# Patient Record
Sex: Male | Born: 1975 | Race: White | Hispanic: No | Marital: Married | Smoking: Former smoker
Health system: Southern US, Community
[De-identification: ages and names within clinical notes are randomized; demographics above are authoritative.]

## PROBLEM LIST (undated history)

## (undated) DIAGNOSIS — F419 Anxiety disorder, unspecified: Secondary | ICD-10-CM

## (undated) DIAGNOSIS — J329 Chronic sinusitis, unspecified: Secondary | ICD-10-CM

## (undated) DIAGNOSIS — R200 Anesthesia of skin: Secondary | ICD-10-CM

## (undated) DIAGNOSIS — I10 Essential (primary) hypertension: Secondary | ICD-10-CM

## (undated) DIAGNOSIS — T8859XA Other complications of anesthesia, initial encounter: Secondary | ICD-10-CM

## (undated) DIAGNOSIS — K859 Acute pancreatitis without necrosis or infection, unspecified: Secondary | ICD-10-CM

## (undated) DIAGNOSIS — K219 Gastro-esophageal reflux disease without esophagitis: Secondary | ICD-10-CM

## (undated) DIAGNOSIS — F909 Attention-deficit hyperactivity disorder, unspecified type: Secondary | ICD-10-CM

## (undated) DIAGNOSIS — M1990 Unspecified osteoarthritis, unspecified site: Secondary | ICD-10-CM

## (undated) DIAGNOSIS — E119 Type 2 diabetes mellitus without complications: Secondary | ICD-10-CM

## (undated) DIAGNOSIS — R202 Paresthesia of skin: Secondary | ICD-10-CM

## (undated) DIAGNOSIS — T4145XA Adverse effect of unspecified anesthetic, initial encounter: Secondary | ICD-10-CM

## (undated) HISTORY — PX: ORTHOPEDIC SURGERY: SHX850

## (undated) HISTORY — DX: Anesthesia of skin: R20.2

## (undated) HISTORY — DX: Gastro-esophageal reflux disease without esophagitis: K21.9

## (undated) HISTORY — DX: Anesthesia of skin: R20.0

---

## 2010-07-24 ENCOUNTER — Emergency Department (HOSPITAL_COMMUNITY)
Admission: EM | Admit: 2010-07-24 | Discharge: 2010-07-25 | Disposition: A | Discharge status: Home / Self Care | Payer: Self-pay | Attending: Emergency Medicine | Admitting: Emergency Medicine

## 2010-07-24 DIAGNOSIS — R071 Chest pain on breathing: Secondary | ICD-10-CM | POA: Insufficient documentation

## 2010-07-25 ENCOUNTER — Emergency Department (HOSPITAL_COMMUNITY): Payer: Self-pay

## 2010-07-25 LAB — POCT CARDIAC MARKERS
CKMB, poc: 2.5 ng/mL (ref 1.0–8.0)
Myoglobin, poc: 185 ng/mL (ref 12–200)
Troponin i, poc: <0.05 ng/mL (ref 0.00–0.09)

## 2010-10-05 ENCOUNTER — Emergency Department (HOSPITAL_COMMUNITY)
Admission: EM | Admit: 2010-10-05 | Discharge: 2010-10-05 | Disposition: A | Discharge status: Home / Self Care | Payer: Self-pay | Attending: Emergency Medicine | Admitting: Emergency Medicine

## 2010-10-05 DIAGNOSIS — R51 Headache: Secondary | ICD-10-CM | POA: Insufficient documentation

## 2010-10-05 DIAGNOSIS — I10 Essential (primary) hypertension: Secondary | ICD-10-CM | POA: Insufficient documentation

## 2010-10-05 DIAGNOSIS — R42 Dizziness and giddiness: Secondary | ICD-10-CM | POA: Insufficient documentation

## 2010-10-05 DIAGNOSIS — R599 Enlarged lymph nodes, unspecified: Secondary | ICD-10-CM | POA: Insufficient documentation

## 2011-01-31 ENCOUNTER — Encounter: Payer: Self-pay | Admitting: Emergency Medicine

## 2011-01-31 ENCOUNTER — Emergency Department (HOSPITAL_COMMUNITY)
Admission: EM | Admit: 2011-01-31 | Discharge: 2011-01-31 | Disposition: A | Discharge status: Home / Self Care | Payer: 59 | Attending: Emergency Medicine | Admitting: Emergency Medicine

## 2011-01-31 DIAGNOSIS — I10 Essential (primary) hypertension: Secondary | ICD-10-CM | POA: Insufficient documentation

## 2011-01-31 DIAGNOSIS — J329 Chronic sinusitis, unspecified: Secondary | ICD-10-CM | POA: Insufficient documentation

## 2011-01-31 HISTORY — DX: Chronic sinusitis, unspecified: J32.9

## 2011-01-31 HISTORY — DX: Essential (primary) hypertension: I10

## 2011-01-31 MED ORDER — SULFAMETHOXAZOLE-TMP DS 800-160 MG PO TABS
1.0000 | ORAL_TABLET | Freq: Once | ORAL | Status: AC
  Administered 2011-01-31: 1 via ORAL
  Filled 2011-01-31: qty 1

## 2011-01-31 MED ORDER — SULFAMETHOXAZOLE-TRIMETHOPRIM 800-160 MG PO TABS: 1.0000 | ORAL_TABLET | Freq: Two times a day (BID) | ORAL | Status: AC

## 2011-01-31 NOTE — ED Notes (Signed)
Patient states has had a sinus infection x 5 weeks; states has upper teeth pain, bilateral ear pain and high blood pressure.

## 2011-01-31 NOTE — ED Provider Notes (Signed)
History     CSN: [id number] Arrival date & time: 01/31/2011 10:09 PM  Chief Complaint  Patient presents with  . Sinusitis  . Hypertension   HPI Comments: Seen 1109. Patient with 5 weeks of sinus pressure. Has annual episodes of sinusitis. No drainage. No fevers.   Patient is a 35 y.o. male presenting with sinusitis and hypertension. The history is provided by the patient.  Sinusitis  This is a new problem. Episode onset: 5 weeks. The problem has not changed since onset.There has been no fever. The pain is at a severity of 7/10. The pain is moderate. The pain has been constant since onset. Associated symptoms include sinus pressure. Associated symptoms comments: Nasal stuffiness, facial pain. Treatments tried: nettie pot, decongestant, allergy medicine, humidifier, nsaids. The treatment provided no relief.  Hypertension    Past Medical History  Diagnosis Date  . Hypertension   . Sinusitis     Past Surgical History  Procedure Date  . Orthopedic surgery     left knee surgery    No family history on file.  History  Substance Use Topics  . Smoking status: Never Smoker   . Smokeless tobacco: Current User    Types: Snuff  . Alcohol Use: Yes     occ      Review of Systems  HENT: Positive for sinus pressure.   All other systems reviewed and are negative.    Physical Exam  BP 130/83  Pulse 67  Temp(Src) 98.5 F (36.9 C) (Oral)  Resp 20  Ht 5\' 11"  (1.803 m)  Wt 220 lb (99.791 kg)  BMI 30.68 kg/m2  SpO2 100%  Physical Exam  Nursing note and vitals reviewed. Constitutional: He is oriented to person, place, and time. He appears well-developed and well-nourished.  HENT:  Head: Normocephalic.  Right Ear: External ear normal.  Left Ear: External ear normal.  Nose: Nose normal.  Mouth/Throat: Oropharynx is clear and moist.       Facial pain, tenderness to maxillary and frontal area  Eyes: EOM are normal.  Neck: Normal range of motion. Neck supple.    Cardiovascular: Normal rate, normal heart sounds and intact distal pulses.   Pulmonary/Chest: Effort normal and breath sounds normal.  Abdominal: Soft. Bowel sounds are normal.  Musculoskeletal: Normal range of motion.  Neurological: He is alert and oriented to person, place, and time.  Skin: Skin is warm and dry.    ED Course  Procedures  Patient with h/o sinusitis and with 5 week course of nasal congestion, facial pain, nasal stuffiness. MDM Reviewed: nursing note and vitals       Nicoletta Dress. Colon Branch, MD 01/31/11 [phone]

## 2011-01-31 NOTE — ED Notes (Signed)
Pt states that he used to take blood pressure medicine and when he lost a bunch of weight and watch what he was eating  he was able to come of the blood pressure medication,

## 2011-01-31 NOTE — ED Notes (Signed)
Pt c/o sinus problems for 5 weeks, high blood pressure, headache, pain to nose, face and eye area. equilibrium was off several days ago, today blood pressure became elevated.

## 2011-02-09 DIAGNOSIS — R079 Chest pain, unspecified: Secondary | ICD-10-CM | POA: Insufficient documentation

## 2011-02-09 DIAGNOSIS — I10 Essential (primary) hypertension: Secondary | ICD-10-CM | POA: Insufficient documentation

## 2011-02-10 ENCOUNTER — Emergency Department (HOSPITAL_COMMUNITY): Payer: Self-pay

## 2011-02-10 ENCOUNTER — Encounter (HOSPITAL_COMMUNITY): Payer: Self-pay | Admitting: Emergency Medicine

## 2011-02-10 ENCOUNTER — Emergency Department (HOSPITAL_COMMUNITY)
Admission: EM | Admit: 2011-02-10 | Discharge: 2011-02-10 | Disposition: A | Discharge status: Home / Self Care | Payer: Self-pay | Attending: Emergency Medicine | Admitting: Emergency Medicine

## 2011-02-10 ENCOUNTER — Other Ambulatory Visit: Payer: Self-pay

## 2011-02-10 DIAGNOSIS — R0789 Other chest pain: Secondary | ICD-10-CM

## 2011-02-10 LAB — POCT I-STAT, CHEM 8
Calcium, Ion: 1.2 mmol/L (ref 1.12–1.32)
Chloride: 106 mEq/L (ref 96–112)
HCT: 43 % (ref 39.0–52.0)
Hemoglobin: 14.6 g/dL (ref 13.0–17.0)
Potassium: 4.4 mEq/L (ref 3.5–5.1)

## 2011-02-10 LAB — POCT I-STAT TROPONIN I: Troponin i, poc: 0 ng/mL (ref 0.00–0.08)

## 2011-02-10 NOTE — ED Notes (Signed)
C/o mid chest tightness and sharp pains that come and go to left side of chest, c/o sore throat, sinus problems for several weeks, and feels little "jittery", hx of panic attacks per pt

## 2011-02-10 NOTE — ED Notes (Signed)
Patient was given a warm blanket and placed in a gown. Patient states he is having a tightness feeling across his chest and sharp pain on the left side. Patient was placed on monitor and RN Gearldine Bienenstock was informed of patient.

## 2011-02-10 NOTE — ED Provider Notes (Signed)
History     CSN: [id number] Arrival date & time: 02/10/2011  1:07 AM  Chief Complaint  Patient presents with  . Chest Pain  . Back Pain  . Headache    HPI  (Consider location/radiation/quality/duration/timing/severity/associated sxs/prior treatment)  HPI Comments: Seen 0138. Patient recently seen and treated for sinusitis. Remains on antibiotics. Denies fever, chills, nausea, vomiting, diaphoresis.  Patient is a 35 y.o. male presenting with chest pain. The history is provided by the patient.  Chest Pain The chest pain began less than 1 hour ago. Episode Length: intermittant sharp pains to the left chest that readiate toward the middle of his chest. Chest pain occurs frequently. The chest pain is unchanged. Associated with: nothing. At its most intense, the pain is at 6/10. The severity of the pain is moderate. The quality of the pain is described as sharp and stabbing. Radiates to: radiates from left chest to middle chest and through to his back. Exacerbated by: nothing. Pertinent negatives for primary symptoms include no fatigue, no cough, no wheezing and no dizziness. He tried nothing for the symptoms. Risk factors include no known risk factors.     Past Medical History  Diagnosis Date  . Hypertension   . Sinusitis     Past Surgical History  Procedure Date  . Orthopedic surgery     left knee surgery    No family history on file.  History  Substance Use Topics  . Smoking status: Never Smoker   . Smokeless tobacco: Current User    Types: Snuff  . Alcohol Use: Yes     occ      Review of Systems  Review of Systems  Constitutional: Negative for fatigue.  Respiratory: Negative for cough and wheezing.   Neurological: Negative for dizziness.  All other systems reviewed and are negative.    Allergies  Review of patient's allergies indicates no known allergies.  Home Medications   Current Outpatient Rx  Name Route Sig Dispense Refill  .  SULFAMETHOXAZOLE-TRIMETHOPRIM 800-160 MG PO TABS Oral Take 1 tablet by mouth 2 (two) times daily. 28 tablet 0    Physical Exam    BP 123/97  Pulse 57  Temp(Src) 98.5 F (36.9 C) (Oral)  Resp 18  Ht 5\' 11"  (1.803 m)  Wt 220 lb (99.791 kg)  BMI 30.68 kg/m2  SpO2 100%  Physical Exam  Nursing note and vitals reviewed. Constitutional: He is oriented to person, place, and time. He appears well-developed and well-nourished. No distress.  HENT:  Head: Normocephalic and atraumatic.  Right Ear: External ear normal.  Left Ear: External ear normal.  Mouth/Throat: Oropharynx is clear and moist.       Mild maxillary facial tenderness to percussion.Nasal mucosa normal.  Eyes: EOM are normal. Pupils are equal, round, and reactive to light.  Neck: Normal range of motion. Neck supple.  Cardiovascular: Normal rate, normal heart sounds and intact distal pulses.   Pulmonary/Chest: Effort normal and breath sounds normal. He has no wheezes. He has no rales. He exhibits no tenderness.  Abdominal: Soft. Bowel sounds are normal.  Musculoskeletal: Normal range of motion.  Lymphadenopathy:    He has no cervical adenopathy.  Neurological: He is alert and oriented to person, place, and time. He has normal reflexes.  Skin: Skin is warm and dry.    ED Course  CRITICAL CARE Performed by: Annamarie Dawley Authorized by: Annamarie Dawley Total critical care time: 35 minutes Critical care was time spent personally by me on the  following activities: development of treatment plan with patient or surrogate, examination of patient, obtaining history from patient or surrogate, ordering and performing treatments and interventions, ordering and review of laboratory studies, ordering and review of radiographic studies, pulse oximetry, re-evaluation of patient's condition and review of old charts.   (including critical care time)  Labs Reviewed  POCT I-STAT, CHEM 8 - Abnormal; Notable for the following:     Glucose, Bld 105 (*)    All other components within normal limits  POCT I-STAT TROPONIN I  I-STAT, CHEM 8  I-STAT TROPONIN I   Dg Chest 2 View  02/10/2011  *RADIOLOGY REPORT*  Clinical Data: Chest pain  CHEST - 2 VIEW  Comparison: 07/25/2010  Findings: Cardiomediastinal contours are unchanged, within normal limits.  Mild eventration of the right hemidiaphragm.  No focal consolidation, pleural effusion, or pneumothorax.  No acute osseous abnormality.  IMPRESSION: No acute process identified.  Original Report Authenticated By: Waneta Martins, M.D.    Date: 02/10/2011  0234  Rate: 56  Rhythm: sinus bradycardia  QRS Axis: normal  Intervals: normal  ST/T Wave abnormalities: normal  Conduction Disutrbances:none  Narrative Interpretation:   Old EKG Reviewed: unchanged  Patient presents with sharp chest pain to the left chest. No significant risk factors for ACS. Character of the pain c/w chest wall pain.Troponin negative, labs unremarkable. Chest xray and EKG normal.Pt stable in ED with no significant deterioration in condition. MDM Reviewed: nursing note and vitals Interpretation: labs, ECG and x-ray           Nicoletta Dress. Colon Branch, MD 02/10/11 [phone]

## 2011-02-10 NOTE — ED Notes (Signed)
Patient states he has been taking sinus medication; states laid down and got up and then laid down again and now c/o sharp chest pain with headache and back pain.

## 2011-02-18 ENCOUNTER — Other Ambulatory Visit: Payer: Self-pay

## 2011-02-18 ENCOUNTER — Emergency Department (HOSPITAL_COMMUNITY)
Admission: EM | Admit: 2011-02-18 | Discharge: 2011-02-19 | Disposition: A | Discharge status: Home / Self Care | Payer: 59 | Attending: Emergency Medicine | Admitting: Emergency Medicine

## 2011-02-18 ENCOUNTER — Emergency Department (HOSPITAL_COMMUNITY): Payer: 59

## 2011-02-18 ENCOUNTER — Encounter (HOSPITAL_COMMUNITY): Payer: Self-pay | Admitting: *Deleted

## 2011-02-18 DIAGNOSIS — F17200 Nicotine dependence, unspecified, uncomplicated: Secondary | ICD-10-CM | POA: Insufficient documentation

## 2011-02-18 DIAGNOSIS — R079 Chest pain, unspecified: Secondary | ICD-10-CM | POA: Insufficient documentation

## 2011-02-18 DIAGNOSIS — R0981 Nasal congestion: Secondary | ICD-10-CM

## 2011-02-18 DIAGNOSIS — F410 Panic disorder [episodic paroxysmal anxiety] without agoraphobia: Secondary | ICD-10-CM | POA: Insufficient documentation

## 2011-02-18 DIAGNOSIS — J3489 Other specified disorders of nose and nasal sinuses: Secondary | ICD-10-CM | POA: Insufficient documentation

## 2011-02-18 LAB — CBC
MCH: 28 pg (ref 26.0–34.0)
Platelets: 298 10*3/uL (ref 150–400)
RBC: 4.86 MIL/uL (ref 4.22–5.81)
RDW: 12.6 % (ref 11.5–15.5)
WBC: 7.8 10*3/uL (ref 4.0–10.5)

## 2011-02-18 LAB — BASIC METABOLIC PANEL
Calcium: 9 mg/dL (ref 8.4–10.5)
GFR calc Af Amer: >60 mL/min (ref >60)
GFR calc non Af Amer: >60 mL/min (ref >60)
Glucose, Bld: 109 mg/dL — ABNORMAL HIGH (ref 70–99)
Potassium: 3.8 mEq/L (ref 3.5–5.1)
Sodium: 137 mEq/L (ref 135–145)

## 2011-02-18 LAB — DIFFERENTIAL
Basophils Absolute: 0 10*3/uL (ref 0.0–0.1)
Eosinophils Absolute: 0.4 10*3/uL (ref 0.0–0.7)
Eosinophils Relative: 6 % — ABNORMAL HIGH (ref 0–5)
Lymphs Abs: 2 10*3/uL (ref 0.7–4.0)
Neutrophils Relative %: 58 % (ref 43–77)

## 2011-02-18 LAB — POCT I-STAT TROPONIN I: Troponin i, poc: 0.01 ng/mL (ref 0.00–0.08)

## 2011-02-18 NOTE — ED Provider Notes (Addendum)
Scribed for [name], MD, the patient was seen in room APA09/APA09. This chart was scribed by [employer]. The patient's care started at 21:21  CSN: [id number] Arrival date & time: 02/18/2011  8:31 PM  Chief Complaint  Patient presents with  . Chest Pain   HPI Jeff Buck is a 35 y.o. male who presents to the Emergency Department complaining of sharp Chest pain with associated tingling in the left arm and difficulty breathing. Reports that chest pain sometimes wakes him up from sleep. Patient reports a three week history of intermittent chest pain with episodes lasting from 20 seconds to 45 minutes. States he came into the ER when he had 2 hours of persistent chest pain. Tingling in the arm is currently resolved. Denies nausea, vomiting, sweating but reports coughing upon arrival in the ED. Also reports a history of panic attacks. Patient is a former smoker and uses alcohol x1 every 6 months. Patient was recently seen in ED for Hypertension. Patient is very anxious.  HPI ELEMENTS: Location: Chest pain  Onset: 2 hrs ago Duration: Persistent since onset  Timing: intermittent Quality: Sharp  Severity: 7/10 on NPS  Context: as above  Associated symptoms: as above.      Past Medical History  Diagnosis Date  . Hypertension   . Sinusitis     Past Surgical History  Procedure Date  . Orthopedic surgery     left knee surgery    Family History  Problem Relation Age of Onset  . Heart failure Mother's side of family  30s    History  Substance Use Topics  . Smoking status: Never Smoker   . Smokeless tobacco: Current User    Types: Snuff  . Alcohol Use: Yes     occ      Review of Systems  Constitutional: Negative for fever.       10 Systems reviewed and are negative for acute change except as noted in the HPI.  HENT: Negative for congestion.   Eyes: Negative for discharge and redness.  Respiratory: Negative for cough and shortness of breath.   Cardiovascular:  Positive for chest pain.  Gastrointestinal: Negative for vomiting and abdominal pain.  Musculoskeletal: Negative for back pain.  Skin: Negative for rash.  Neurological: Negative for syncope, numbness and headaches.  Psychiatric/Behavioral:       No behavior change.    Allergies  Review of patient's allergies indicates no known allergies.  Home Medications  No current outpatient prescriptions on file.  BP 124/85  Pulse 74  Temp(Src) 98.4 F (36.9 C) (Oral)  Resp 20  Ht 5\' 11"  (1.803 m)  Wt 240 lb (108.863 kg)  BMI 33.47 kg/m2  SpO2 98%  Physical Exam  Nursing note and vitals reviewed. Constitutional:       Awake, alert, nontoxic appearance with baseline speech for patient.  HENT:  Head: Normocephalic and atraumatic.  Mouth/Throat: Oropharynx is clear and moist. No oropharyngeal exudate.  Eyes: Conjunctivae and EOM are normal. Pupils are equal, round, and reactive to light. Right eye exhibits no discharge. Left eye exhibits no discharge. No scleral icterus.  Neck: Neck supple.  Cardiovascular: Normal rate and regular rhythm.   No murmur heard. Pulmonary/Chest: Effort normal and breath sounds normal. No stridor. No respiratory distress. He has no wheezes. He has no rales. He exhibits no tenderness.  Abdominal: Soft. Bowel sounds are normal. He exhibits no mass. There is no tenderness. There is no rebound.  Musculoskeletal: He exhibits no tenderness.  Baseline ROM, moves extremities with no obvious new focal weakness.  Lymphadenopathy:    He has no cervical adenopathy.  Neurological:       Awake, alert, cooperative and aware of situation; no facial asymmetry; tongue midline; major cranial nerves appear intact; no pronator drift,   Skin: Skin is warm and dry. No rash noted. He is not diaphoretic. No erythema.  Psychiatric: His mood appears anxious.    ED Course  Procedures  OTHER DATA REVIEWED: Nursing notes, vital signs, and past medical records reviewed.     DIAGNOSTIC STUDIES: Oxygen Saturation is 100% on 2 liters/min via Patient connected to nasal cannula oxygen, normal by my interpretation.    LABS / RADIOLOGY: Normal, no evidence for cardiac or pulmonary problems.  Results for orders placed during the hospital encounter of 02/18/11  CBC      Component Value Range   WBC 7.8  4.0 - 10.5 (K/uL)   RBC 4.86  4.22 - 5.81 (MIL/uL)   Hemoglobin 13.6  13.0 - 17.0 (g/dL)   HCT 16.1  09.6 - 04.5 (%)   MCV 81.9  78.0 - 100.0 (fL)   MCH 28.0  26.0 - 34.0 (pg)   MCHC 34.2  30.0 - 36.0 (g/dL)   RDW 40.9  81.1 - 91.4 (%)   Platelets 298  150 - 400 (K/uL)  DIFFERENTIAL      Component Value Range   Neutrophils Relative 58  43 - 77 (%)   Neutro Abs 4.6  1.7 - 7.7 (K/uL)   Lymphocytes Relative 26  12 - 46 (%)   Lymphs Abs 2.0  0.7 - 4.0 (K/uL)   Monocytes Relative 10  3 - 12 (%)   Monocytes Absolute 0.8  0.1 - 1.0 (K/uL)   Eosinophils Relative 6 (*) 0 - 5 (%)   Eosinophils Absolute 0.4  0.0 - 0.7 (K/uL)   Basophils Relative 1  0 - 1 (%)   Basophils Absolute 0.0  0.0 - 0.1 (K/uL)  BASIC METABOLIC PANEL      Component Value Range   Sodium 137  135 - 145 (mEq/L)   Potassium 3.8  3.5 - 5.1 (mEq/L)   Chloride 101  96 - 112 (mEq/L)   CO2 28  19 - 32 (mEq/L)   Glucose, Bld 109 (*) 70 - 99 (mg/dL)   BUN 11  6 - 23 (mg/dL)   Creatinine, Ser 7.82  0.50 - 1.35 (mg/dL)   Calcium 9.0  8.4 - 95.6 (mg/dL)   GFR calc non Af Amer >60  >60 (mL/min)   GFR calc Af Amer >60  >60 (mL/min)  POCT I-STAT TROPONIN I      Component Value Range   Troponin i, poc 0.01  0.00 - 0.08 (ng/mL)   Comment 3             EKG Date: 02/19/2011  Rate: 67  Rhythm: normal sinus rhythm  QRS Axis: normal  Intervals: normal  ST/T Wave abnormalities: normal  Conduction Disutrbances:none  Narrative Interpretation:   Old EKG Reviewed: unchanged  Dg Chest 2 View  02/18/2011  *RADIOLOGY REPORT*  Clinical Data: Chest pain  CHEST - 2 VIEW  Comparison: 02/10/2011  Findings: Lungs  are clear. No pleural effusion or pneumothorax.  Cardiomediastinal silhouette is within normal limits.  Visualized osseous structures are within normal limits.  IMPRESSION: No evidence of acute cardiopulmonary disease.  Original Report Authenticated By: Charline Bills, M.D.   Dg Chest 2 View  02/10/2011  *RADIOLOGY REPORT*  Clinical Data: Chest pain  CHEST - 2 VIEW  Comparison: 07/25/2010  Findings: Cardiomediastinal contours are unchanged, within normal limits.  Mild eventration of the right hemidiaphragm.  No focal consolidation, pleural effusion, or pneumothorax.  No acute osseous abnormality.  IMPRESSION: No acute process identified.  Original Report Authenticated By: Waneta Martins, M.D.      ED COURSE / COORDINATION OF CARE: 21:30 - EDP examined patient at bed side and ordered the following Orders Placed This Encounter  Procedures  . DG Chest 2 View  . CBC  . Differential  . Basic metabolic panel    12:03 AM at this time. Vital had trended normal. Patient has no chest pain, now. He complained of sinus congestion, which has been going on for several weeks and occurs twice a year. At this time. I advised him to followup with her PCP about it. MDM: Chest pain, atypical for cardiac disease. He has a negative workup tonight as he did about one week ago. The pain is nonspecific and unlikely to represent ACS, PE, pneumonia, metabolic instability, or occult infection. IMPRESSION: #1 nonspecific chest pain. #2 history of panic attacks. #3 sinus congestion.  PLAN:  Home  The patient is to return the emergency department if there is any worsening of symptoms. I have reviewed the discharge instructions with the patient  CONDITION ON DISCHARGE: Stable  MEDICATIONS GIVEN IN THE E.D. Medications - No data to display  DISCHARGE MEDICATIONS: New Prescriptions   No medications on file    SCRIBE ATTESTATION: I personally performed the services described in this documentation, which was  scribed in my presence. The recorded information has been reviewed and considered. [name], MD     [name], MD 02/19/11 0012  [name], MD 02/19/11 4540  [name], MD 03/03/11 [phone]

## 2011-02-18 NOTE — ED Notes (Signed)
Pt c/o centralized intermittent chest pain and left arm tingling x 2hrs.

## 2011-03-28 ENCOUNTER — Emergency Department (HOSPITAL_COMMUNITY)
Admission: EM | Admit: 2011-03-28 | Discharge: 2011-03-29 | Disposition: A | Discharge status: Home / Self Care | Payer: 59 | Attending: Emergency Medicine | Admitting: Emergency Medicine

## 2011-03-28 ENCOUNTER — Encounter (HOSPITAL_COMMUNITY): Payer: Self-pay | Admitting: Emergency Medicine

## 2011-03-28 ENCOUNTER — Emergency Department (HOSPITAL_COMMUNITY): Payer: 59

## 2011-03-28 ENCOUNTER — Other Ambulatory Visit: Payer: Self-pay

## 2011-03-28 DIAGNOSIS — R05 Cough: Secondary | ICD-10-CM | POA: Insufficient documentation

## 2011-03-28 DIAGNOSIS — I10 Essential (primary) hypertension: Secondary | ICD-10-CM | POA: Insufficient documentation

## 2011-03-28 DIAGNOSIS — R0602 Shortness of breath: Secondary | ICD-10-CM | POA: Insufficient documentation

## 2011-03-28 DIAGNOSIS — R0789 Other chest pain: Secondary | ICD-10-CM

## 2011-03-28 DIAGNOSIS — R079 Chest pain, unspecified: Secondary | ICD-10-CM | POA: Insufficient documentation

## 2011-03-28 DIAGNOSIS — R059 Cough, unspecified: Secondary | ICD-10-CM | POA: Insufficient documentation

## 2011-03-28 LAB — BASIC METABOLIC PANEL
CO2: 26 mEq/L (ref 19–32)
Chloride: 105 mEq/L (ref 96–112)
Creatinine, Ser: 0.95 mg/dL (ref 0.50–1.35)
GFR calc Af Amer: >90 mL/min (ref >90)
Sodium: 139 mEq/L (ref 135–145)

## 2011-03-28 LAB — DIFFERENTIAL
Basophils Absolute: 0 10*3/uL (ref 0.0–0.1)
Basophils Relative: 0 % (ref 0–1)
Lymphocytes Relative: 31 % (ref 12–46)
Monocytes Absolute: 1.1 10*3/uL — ABNORMAL HIGH (ref 0.1–1.0)
Neutro Abs: 5.5 10*3/uL (ref 1.7–7.7)
Neutrophils Relative %: 55 % (ref 43–77)

## 2011-03-28 LAB — CK TOTAL AND CKMB (NOT AT ARMC)
CK, MB: 5.6 ng/mL — ABNORMAL HIGH (ref 0.3–4.0)
Total CK: 330 U/L — ABNORMAL HIGH (ref 7–232)

## 2011-03-28 LAB — CBC
MCHC: 35.1 g/dL (ref 30.0–36.0)
Platelets: 322 10*3/uL (ref 150–400)
RDW: 12.7 % (ref 11.5–15.5)
WBC: 9.9 10*3/uL (ref 4.0–10.5)

## 2011-03-28 LAB — TROPONIN I: Troponin I: <0.3 ng/mL (ref <0.30)

## 2011-03-28 MED ORDER — ASPIRIN 81 MG PO CHEW: 324.0000 mg | CHEWABLE_TABLET | Freq: Once | ORAL | Status: DC

## 2011-03-28 MED ORDER — SODIUM CHLORIDE 0.9 % IV SOLN: INTRAVENOUS | Status: DC

## 2011-03-28 MED ORDER — GI COCKTAIL ~~LOC~~
30.0000 mL | Freq: Once | ORAL | Status: AC
  Administered 2011-03-28: 30 mL via ORAL
  Filled 2011-03-28: qty 30

## 2011-03-28 MED ORDER — SODIUM CHLORIDE 0.9 % IV BOLUS (SEPSIS)
500.0000 mL | Freq: Once | INTRAVENOUS | Status: AC
  Administered 2011-03-28: via INTRAVENOUS

## 2011-03-28 MED ORDER — ASPIRIN 325 MG PO TABS
325.0000 mg | ORAL_TABLET | Freq: Once | ORAL | Status: AC
  Administered 2011-03-28: 325 mg via ORAL
  Filled 2011-03-28: qty 1

## 2011-03-28 NOTE — ED Provider Notes (Signed)
History     CSN: [id number] Arrival date & time: 03/28/2011 10:38 PM   First MD Initiated Contact with Patient 03/28/11 2301      Chief Complaint  Patient presents with  . Chest Pain    (Consider location/radiation/quality/duration/timing/severity/associated sxs/prior treatment) Patient is a 35 y.o. male presenting with chest pain. The history is provided by the patient. No language interpreter was used.  Chest Pain The chest pain began 3 - 5 hours ago. Duration of episode(s) is 4 hours. Chest pain occurs constantly. The chest pain is unchanged. Associated with: nothing. At its most intense, the pain is at 8/10. The pain is currently at 8/10. The quality of the pain is described as pressure-like. The pain does not radiate. Exacerbated by: nothing. Primary symptoms include cough and wheezing. Pertinent negatives for primary symptoms include no fever, no fatigue, no syncope, no palpitations, no abdominal pain, no nausea, no vomiting, no dizziness and no altered mental status.  Pertinent negatives for associated symptoms include no claudication, no diaphoresis, no lower extremity edema, no near-syncope, no numbness, no orthopnea, no paroxysmal nocturnal dyspnea and no weakness. He tried nothing for the symptoms. Risk factors include no known risk factors.  Pertinent negatives for past medical history include no aneurysm, no anxiety/panic attacks, no aortic aneurysm, no aortic dissection, no arrhythmia, no bicuspid aortic valve, no CAD, no cancer, no congenital heart disease, no Marfan's syndrome, no MI, no mitral valve prolapse, no pacemaker, no PE, no PVD, no recent injury and no rheumatic fever.  Pertinent negatives for family medical history include: family history of aortic dissection, no PE in family and no PVD in family.  Procedure history is positive for stress echo.     Past Medical History  Diagnosis Date  . Hypertension   . Sinusitis     Past Surgical History  Procedure Date  .  Orthopedic surgery     left knee surgery    Family History  Problem Relation Age of Onset  . Heart failure Other     History  Substance Use Topics  . Smoking status: Former Games developer  . Smokeless tobacco: Current User    Types: Snuff  . Alcohol Use: Yes     occ      Review of Systems  Constitutional: Negative for fever, diaphoresis and fatigue.  HENT: Negative for facial swelling.   Eyes: Negative for discharge.  Respiratory: Positive for cough and wheezing.   Cardiovascular: Positive for chest pain. Negative for palpitations, orthopnea, claudication, syncope and near-syncope.  Gastrointestinal: Negative for nausea, vomiting, abdominal pain and abdominal distention.  Genitourinary: Negative for difficulty urinating.  Musculoskeletal: Negative for arthralgias.  Neurological: Negative for dizziness, weakness and numbness.  Hematological: Negative.   Psychiatric/Behavioral: Negative.  Negative for altered mental status.    Allergies  Review of patient's allergies indicates no known allergies.  Home Medications   Current Outpatient Rx  Name Route Sig Dispense Refill  . OMEGA-3 FATTY ACIDS 1000 MG PO CAPS Oral Take 1 g by mouth daily.      . GUAIFENESIN 100 MG/5ML PO SYRP Oral Take 100-200 mg by mouth as needed. For congestion       BP 131/99  Pulse 62  Temp(Src) 98.1 F (36.7 C) (Oral)  Resp 20  Ht 5\' 11"  (1.803 m)  Wt 225 lb (102.059 kg)  BMI 31.38 kg/m2  SpO2 98%  Physical Exam  Constitutional: He is oriented to person, place, and time. He appears well-developed and well-nourished. No distress.  HENT:  Head: Normocephalic and atraumatic.  Right Ear: External ear normal.  Left Ear: External ear normal.  Mouth/Throat: No oropharyngeal exudate.  Eyes: EOM are normal. Pupils are equal, round, and reactive to light. Right eye exhibits no discharge. Left eye exhibits discharge.  Neck: Normal range of motion. Neck supple.  Cardiovascular: Normal rate and regular  rhythm.   Pulmonary/Chest: Effort normal and breath sounds normal. No respiratory distress.  Abdominal: Soft. Bowel sounds are normal. He exhibits no distension.  Musculoskeletal: Normal range of motion. He exhibits no edema.  Neurological: He is alert and oriented to person, place, and time.  Skin: Skin is warm and dry. He is not diaphoretic.  Psychiatric: He has a normal mood and affect.    ED Course  Procedures (including critical care time)  Labs Reviewed  DIFFERENTIAL - Abnormal; Notable for the following:    Monocytes Absolute 1.1 (*)    All other components within normal limits  CBC  BASIC METABOLIC PANEL  CK TOTAL AND CKMB  TROPONIN I   No results found.   No diagnosis found.    MDM   2 negative sets of troponins, patient told to return for worsening shortness of breath, chest pain, sweatiness, nausea or any concerns and to follow at [facility] for outpatient stress test.  Patient verbalizes understanding and agrees to follow up  Date: 03/28/2011  Rate:71  Rhythm: normal sinus rhythm  QRS Axis: normal  Intervals: normal  ST/T Wave abnormalities: normal  Conduction Disutrbances:none  Narrative Interpretation:   Old EKG Reviewed: unchanged         Darwyn Ponzo K Auriana Scalia-Rasch, MD 03/29/11 0207

## 2011-03-28 NOTE — ED Notes (Signed)
Patient denies any heavy lifting; states has been coughing recently.  Patient states has been having chest pain since yesterday.  Breath sounds clear throughout all lung fields.

## 2011-03-28 NOTE — ED Notes (Signed)
Patient c/o generalized chest pain that started yesterday; denies N/V, but states some shortness of breath.  Patient states has been coughing a lot recently.

## 2011-03-29 ENCOUNTER — Encounter (HOSPITAL_COMMUNITY): Payer: Self-pay | Admitting: Emergency Medicine

## 2011-03-29 LAB — CARDIAC PANEL(CRET KIN+CKTOT+MB+TROPI): Relative Index: 1.7 (ref 0.0–2.5)

## 2011-04-04 ENCOUNTER — Encounter: Payer: Self-pay | Admitting: Adult Health

## 2011-04-05 ENCOUNTER — Ambulatory Visit (INDEPENDENT_AMBULATORY_CARE_PROVIDER_SITE_OTHER): Payer: 59 | Admitting: Adult Health

## 2011-04-05 ENCOUNTER — Encounter: Payer: Self-pay | Admitting: Adult Health

## 2011-04-05 DIAGNOSIS — R002 Palpitations: Secondary | ICD-10-CM

## 2011-04-05 DIAGNOSIS — E785 Hyperlipidemia, unspecified: Secondary | ICD-10-CM

## 2011-04-05 DIAGNOSIS — R12 Heartburn: Secondary | ICD-10-CM

## 2011-04-05 DIAGNOSIS — R109 Unspecified abdominal pain: Secondary | ICD-10-CM

## 2011-04-05 DIAGNOSIS — R079 Chest pain, unspecified: Secondary | ICD-10-CM

## 2011-04-05 DIAGNOSIS — I10 Essential (primary) hypertension: Secondary | ICD-10-CM

## 2011-04-05 MED ORDER — AMLODIPINE BESYLATE 5 MG PO TABS: 5.0000 mg | ORAL_TABLET | ORAL | Status: DC | PRN

## 2011-04-05 NOTE — Patient Instructions (Addendum)
**Note De-Identified Libbey Duce Obfuscation** Your physician has recommended that you wear an event monitor. Event monitors are medical devices that record the heart's electrical activity. Doctors most often [place] these monitors to diagnose arrhythmias. Arrhythmias are problems with the speed or rhythm of the heartbeat. The monitor is a small, portable device. You can wear one while you do your normal daily activities. This is usually used to diagnose what is causing palpitations/syncope (passing out).  Your physician has recommended you make the following change in your medication: take Amlodipine 5 mg as needed for elevated BP.   Your physician recommends that you return for lab work in: this week  Your physician recommends that you have an Abdominal Ultra Sound.  Your physician recommends that you schedule a follow-up appointment in: after test

## 2011-04-06 ENCOUNTER — Encounter: Payer: Self-pay | Admitting: Adult Health

## 2011-04-06 DIAGNOSIS — R002 Palpitations: Secondary | ICD-10-CM | POA: Insufficient documentation

## 2011-04-06 DIAGNOSIS — R12 Heartburn: Secondary | ICD-10-CM | POA: Insufficient documentation

## 2011-04-06 DIAGNOSIS — I10 Essential (primary) hypertension: Secondary | ICD-10-CM | POA: Insufficient documentation

## 2011-04-06 NOTE — Assessment & Plan Note (Signed)
Plan event recorder to ascertain rhythm disturbances, morphology, duration and frequency.  He has been seen by me and by Dr. Dietrich Pates on evaluation. He has multiple complaint with no know etiology at this time. Will also check 24 urine for metenephrines to r/o pheochromocytoma.

## 2011-04-06 NOTE — Assessment & Plan Note (Signed)
He has lost a lot of wt and is complaining of frequent heart burn symptoms. I will check a GB ultrasound to evaluate for GB disease.  He will be placed on PPI as well.

## 2011-04-06 NOTE — Progress Notes (Signed)
HPI: Mr. Jeff Buck is a 35 y/o patient new to our practice we are seeing post ER visit where he was seen and evaluated for chest pain and palpitations. He states that he has been having discomfort for years and has had a cardiac cath in 2006 in [place]. He states it was normal. He has begun experiencing heart burn like symptoms, chest pressure and palpitations that sometime awaken him at night.  He also expresses waking up at night with elevated blood pressure with self reported readings of systolic of > 200 mmHg.    He has had a history of hypertension but is not on medication at this time. He has lost approximately 30 lbs with diet and exercise.   He is hoping to lose to below 200 lbs.  He states the palpitations come 2-3 times a week, but sometimes less. He denies use of excessive caffeine, drinking coffee or tea in the am only.  No syncopal episodes or NV associated with this. He admits to history of panic attacks for which he takes xanax but states the medication does not help with the palpitations. He does not have PCP at this time.  No Known Allergies  Current Outpatient Prescriptions  Medication Sig Dispense Refill  . fish oil-omega-3 fatty acids 1000 MG capsule Take 1 g by mouth daily.        Marland Kitchen guaifenesin (ROBITUSSIN CHEST CONGESTION) 100 MG/5ML syrup Take 100-200 mg by mouth as needed. For congestion       . amLODipine (NORVASC) 5 MG tablet Take 1 tablet (5 mg total) by mouth as needed. For blood pressure  30 tablet  3    Past Medical History  Diagnosis Date  . Hypertension   . Sinusitis     Past Surgical History  Procedure Date  . Orthopedic surgery     left knee surgery    GNF:AOZHYQ of systems complete and found to be negative unless listed above PHYSICAL EXAM BP 129/84  Pulse 80  Ht 5\' 11"  (1.803 m)  Wt 236 lb (107.049 kg)  BMI 32.92 kg/m2  SpO2 98%  General: Well developed, well nourished, in no acute distress Head: Eyes PERRLA, No xanthomas.   Normal cephalic  and atramatic  Lungs: Clear bilaterally to auscultation and percussion. Heart: HRRR S1 S2, without MRG.  Pulses are 2+ & equal.            No carotid bruit. No JVD.  No abdominal bruits. No femoral bruits. Abdomen: Bowel sounds are positive, abdomen soft and non-tender without masses or                  Hernia's noted. Msk:  Back normal, normal gait. Normal strength and tone for age. Extremities: No clubbing, cyanosis or edema.  DP +1 Neuro: Alert and oriented X 3. Psych:  Good affect, responds appropriately  EKG:NSR rate of 68 bpm.  No evidence of pre-excitation.  ASSESSMENT AND PLAN

## 2011-04-06 NOTE — Assessment & Plan Note (Signed)
He is given Rx for Norvasc for prn use in the setting of sudden hypertension.  Cannot rule out sleep apnea causing this. This can be considered on next visit, should labs listed above prove to be negative.

## 2011-04-07 ENCOUNTER — Ambulatory Visit (HOSPITAL_COMMUNITY)
Admission: RE | Admit: 2011-04-07 | Discharge: 2011-04-07 | Disposition: A | Discharge status: Home / Self Care | Payer: 59 | Source: Ambulatory Visit | Attending: Adult Health | Admitting: Adult Health

## 2011-04-07 DIAGNOSIS — R109 Unspecified abdominal pain: Secondary | ICD-10-CM

## 2011-04-07 LAB — HEPATIC FUNCTION PANEL
ALT: 29 U/L (ref 0–53)
Albumin: 4.3 g/dL (ref 3.5–5.2)
Alkaline Phosphatase: 71 U/L (ref 39–117)
Total Protein: 6.9 g/dL (ref 6.0–8.3)

## 2011-04-07 NOTE — Progress Notes (Signed)
Addended by: Griffon Herberg L on: 04/07/2011 02:13 PM   Modules accepted: Orders  

## 2011-04-08 LAB — LIPID PANEL
Cholesterol: 154 mg/dL (ref 0–200)
HDL: 45 mg/dL (ref >39)
LDL Cholesterol: 95 mg/dL (ref 0–99)
Triglycerides: 70 mg/dL (ref <150)

## 2011-04-24 ENCOUNTER — Telehealth: Payer: Self-pay

## 2011-04-27 NOTE — Telephone Encounter (Signed)
**Note De-Identified Summerlynn Glauser Obfuscation** Elon Jester states that she will attempt to locate pt. and advise him to call this office./LV

## 2011-05-11 ENCOUNTER — Other Ambulatory Visit: Payer: Self-pay

## 2011-05-11 ENCOUNTER — Encounter (HOSPITAL_COMMUNITY): Payer: Self-pay | Admitting: Emergency Medicine

## 2011-05-11 ENCOUNTER — Emergency Department (HOSPITAL_COMMUNITY)
Admission: EM | Admit: 2011-05-11 | Discharge: 2011-05-11 | Disposition: A | Discharge status: Home / Self Care | Payer: 59 | Attending: Emergency Medicine | Admitting: Emergency Medicine

## 2011-05-11 DIAGNOSIS — Z87891 Personal history of nicotine dependence: Secondary | ICD-10-CM | POA: Insufficient documentation

## 2011-05-11 DIAGNOSIS — R944 Abnormal results of kidney function studies: Secondary | ICD-10-CM | POA: Insufficient documentation

## 2011-05-11 DIAGNOSIS — R51 Headache: Secondary | ICD-10-CM | POA: Insufficient documentation

## 2011-05-11 DIAGNOSIS — R7309 Other abnormal glucose: Secondary | ICD-10-CM | POA: Insufficient documentation

## 2011-05-11 DIAGNOSIS — I10 Essential (primary) hypertension: Secondary | ICD-10-CM | POA: Insufficient documentation

## 2011-05-11 HISTORY — DX: Anxiety disorder, unspecified: F41.9

## 2011-05-11 LAB — URINALYSIS, ROUTINE W REFLEX MICROSCOPIC
Bilirubin Urine: NEGATIVE
Ketones, ur: NEGATIVE mg/dL
Leukocytes, UA: NEGATIVE
Nitrite: NEGATIVE
Protein, ur: NEGATIVE mg/dL
Urobilinogen, UA: 0.2 mg/dL (ref 0.0–1.0)
pH: 6 (ref 5.0–8.0)

## 2011-05-11 LAB — BASIC METABOLIC PANEL
Calcium: 9.8 mg/dL (ref 8.4–10.5)
Chloride: 102 mEq/L (ref 96–112)
Creatinine, Ser: 1.1 mg/dL (ref 0.50–1.35)
GFR calc Af Amer: >90 mL/min (ref >90)
GFR calc non Af Amer: 86 mL/min — ABNORMAL LOW (ref >90)

## 2011-05-11 MED ORDER — ACETAMINOPHEN 500 MG PO TABS
1000.0000 mg | ORAL_TABLET | Freq: Once | ORAL | Status: AC
  Administered 2011-05-11: 1000 mg via ORAL
  Filled 2011-05-11: qty 2

## 2011-05-11 NOTE — ED Provider Notes (Signed)
History     CSN: [id number]  Arrival date & time 05/11/11  1556   First MD Initiated Contact with Patient 05/11/11 1608      Chief Complaint  Patient presents with  . Hypertension    (Consider location/radiation/quality/duration/timing/severity/associated sxs/prior treatment) HPI The patient presents with concerns of hypertension. He notes that he has recently been in his usual state of health, including taking daily antihypertensives. Just prior to presentation the patient had an episode of spontaneous onset facial flushing, blurred vision. No pain. No recollection of specific precipitant. The patient took an additional dose of his blood pressure medication, and on arrival the patient has no complaints. He did not take his blood pressure during this episode. Past Medical History  Diagnosis Date  . Hypertension   . Sinusitis   . Anxiety     Past Surgical History  Procedure Date  . Orthopedic surgery     left knee surgery    Family History  Problem Relation Age of Onset  . Heart failure Other     History  Substance Use Topics  . Smoking status: Former Smoker    Quit date: 05/22/2010  . Smokeless tobacco: Current User    Types: Snuff  . Alcohol Use: No      Review of Systems  Constitutional:       Per HPI, otherwise negative  HENT:       Per HPI, otherwise negative  Eyes: Negative.   Respiratory:       Per HPI, otherwise negative  Cardiovascular:       Per HPI, otherwise negative  Gastrointestinal: Negative for vomiting.  Genitourinary: Negative.   Musculoskeletal:       Per HPI, otherwise negative  Skin: Negative.   Neurological: Negative for syncope.    Allergies  Review of patient's allergies indicates no known allergies.  Home Medications   Current Outpatient Rx  Name Route Sig Dispense Refill  . AMLODIPINE BESYLATE 5 MG PO TABS Oral Take 1 tablet (5 mg total) by mouth as needed. For blood pressure 30 tablet 3  . OMEGA-3 FATTY ACIDS 1000 MG  PO CAPS Oral Take 1 g by mouth daily.      . GUAIFENESIN 100 MG/5ML PO SYRP Oral Take 100-200 mg by mouth as needed. For congestion       BP 135/95  Pulse 66  Temp(Src) 98.5 F (36.9 C) (Oral)  Resp 18  Ht 5\' 11"  (1.803 m)  Wt 225 lb (102.059 kg)  BMI 31.38 kg/m2  SpO2 98%  Physical Exam  Nursing note and vitals reviewed. Constitutional: He is oriented to person, place, and time. He appears well-developed. No distress.  HENT:  Head: Normocephalic and atraumatic.  Eyes: Conjunctivae and EOM are normal.  Cardiovascular: Normal rate and regular rhythm.   Pulmonary/Chest: Effort normal. No stridor. No respiratory distress.  Abdominal: He exhibits no distension.  Musculoskeletal: He exhibits no edema.  Neurological: He is alert and oriented to person, place, and time.  Skin: Skin is warm and dry.  Psychiatric: He has a normal mood and affect.    ED Course  Procedures (including critical care time)   Labs Reviewed  BASIC METABOLIC PANEL  URINALYSIS, ROUTINE W REFLEX MICROSCOPIC   No results found.   No diagnosis found.     Date: 05/11/2011  Rate: 67  Rhythm: normal sinus rhythm  QRS Axis: normal  Intervals: normal  ST/T Wave abnormalities: normal  Conduction Disutrbances:none  Narrative Interpretation:   Old EKG Reviewed:  none available NORMAL ECG  Pulse ox, 99%, room air, normal MDM  This generally well 35 year old male now presents with concerns of hypertension following an episode of facial flushing. On exam the patient is in no distress and notes no symptoms. The patient did have a mild headache during his ED stay, though this improved with Tylenol. The patient's labs are notable for mild hyperglycemia, normal though minimally elevated creatinine. Given these findings, the patient's description of episodic symptoms, he will be instructed to increase his amlodipine to 10 mg daily, to take a daily blood pressure reading, keep a low, and follow up with his primary  care physician within the next 2 weeks. The patient was discharged in stable condition        Gerhard Munch, MD 05/11/11 1904

## 2011-05-11 NOTE — ED Notes (Signed)
Patient c/o feeling flush with blurred vision. Patient states "When I feel like this it's usually my blood pressure going up." Patient reports taking 10mg  of blood pressure pill-unsure of name (possible metoprolol.)

## 2011-05-11 NOTE — ED Notes (Signed)
Pt encouraged to void but states is unable to provide urine specimen.

## 2011-05-11 NOTE — ED Notes (Signed)
C/o headache and feeling flush.  Will notify MD.

## 2012-04-09 ENCOUNTER — Encounter (HOSPITAL_COMMUNITY): Payer: Self-pay | Admitting: Emergency Medicine

## 2012-04-09 ENCOUNTER — Emergency Department (HOSPITAL_COMMUNITY)
Admission: EM | Admit: 2012-04-09 | Discharge: 2012-04-09 | Disposition: A | Discharge status: Home / Self Care | Payer: Worker's Compensation | Attending: Emergency Medicine | Admitting: Emergency Medicine

## 2012-04-09 DIAGNOSIS — Y929 Unspecified place or not applicable: Secondary | ICD-10-CM | POA: Insufficient documentation

## 2012-04-09 DIAGNOSIS — S00209A Unspecified superficial injury of unspecified eyelid and periocular area, initial encounter: Secondary | ICD-10-CM | POA: Insufficient documentation

## 2012-04-09 DIAGNOSIS — Z8659 Personal history of other mental and behavioral disorders: Secondary | ICD-10-CM | POA: Insufficient documentation

## 2012-04-09 DIAGNOSIS — I10 Essential (primary) hypertension: Secondary | ICD-10-CM | POA: Insufficient documentation

## 2012-04-09 DIAGNOSIS — Y939 Activity, unspecified: Secondary | ICD-10-CM | POA: Insufficient documentation

## 2012-04-09 DIAGNOSIS — T1590XA Foreign body on external eye, part unspecified, unspecified eye, initial encounter: Secondary | ICD-10-CM | POA: Insufficient documentation

## 2012-04-09 DIAGNOSIS — Z87891 Personal history of nicotine dependence: Secondary | ICD-10-CM | POA: Insufficient documentation

## 2012-04-09 MED ORDER — SULFACETAMIDE SODIUM 10 % OP SOLN: 2.0000 [drp] | Freq: Four times a day (QID) | OPHTHALMIC | Status: AC

## 2012-04-09 MED ORDER — FLUORESCEIN SODIUM 1 MG OP STRP
1.0000 | ORAL_STRIP | Freq: Once | OPHTHALMIC | Status: AC
  Administered 2012-04-09: 1 via OPHTHALMIC
  Filled 2012-04-09: qty 1

## 2012-04-09 MED ORDER — TETRACAINE HCL 0.5 % OP SOLN
2.0000 [drp] | Freq: Once | OPHTHALMIC | Status: AC
  Administered 2012-04-09: 2 [drp] via OPHTHALMIC
  Filled 2012-04-09: qty 2

## 2012-04-09 NOTE — ED Notes (Signed)
Pt states that he got something in his eye while he was at work.  Pt's left eye is mildly bloodshot but not any more than the other.  States he feels "pressure"

## 2012-04-09 NOTE — ED Provider Notes (Signed)
Medical screening examination/treatment/procedure(s) were performed by non-physician practitioner and as supervising physician I was immediately available for consultation/collaboration.   Shazia Mitchener, MD 04/09/12 2216 

## 2012-04-09 NOTE — ED Notes (Signed)
R eye 20/40 L eye 20/30

## 2012-04-09 NOTE — ED Provider Notes (Signed)
History     CSN: [id number]  Arrival date & time 04/09/12  1529   First MD Initiated Contact with Patient 04/09/12 1636      Chief Complaint  Patient presents with  . Eye Injury    (Consider location/radiation/quality/duration/timing/severity/associated sxs/prior treatment) HPI The patient presents to the ED with L eye pain since 1300 today.  The pain started at work while [employer], water [employer].  He denies a known FB.  Reports periorbital swelling, which now has subsided.  Complains of watery eye and mild photophobia and reports a burning discomfort with light.  He wears glass which he did not bring to the ED and reports visual acuity is unequal at baseline.  Complains of blurry vision in L eye.  Past Medical History  Diagnosis Date  . Hypertension   . Sinusitis   . Anxiety     Past Surgical History  Procedure Date  . Orthopedic surgery     left knee surgery    Family History  Problem Relation Age of Onset  . Heart failure Other     History  Substance Use Topics  . Smoking status: Former Smoker    Quit date: 05/22/2010  . Smokeless tobacco: Current User    Types: Snuff  . Alcohol Use: No      Review of Systems All other systems negative except as documented in the HPI. All pertinent positives and negatives as reviewed in the HPI.  Allergies  Review of patient's allergies indicates no known allergies.  Home Medications     BP 131/75  Pulse 62  Temp 98.3 F (36.8 C) (Oral)  Resp 12  SpO2 96%  Physical Exam  Nursing note and vitals reviewed. Constitutional: He appears well-developed and well-nourished.  HENT:  Head: Atraumatic.  Eyes: Left eye exhibits discharge. No foreign body present in the left eye. Right conjunctiva is not injected. Right conjunctiva has no hemorrhage. Left conjunctiva is injected. Left conjunctiva has no hemorrhage. No scleral icterus. Right eye exhibits normal extraocular motion and no nystagmus. Left eye exhibits  normal extraocular motion and no nystagmus. Right pupil is round and reactive. Left pupil is round and reactive. Pupils are equal.  Slit lamp exam:      The right eye shows no foreign body.       The left eye shows fluorescein uptake.       Clear watery discharge.  Yellow colored debris with dirt removed from medial corner.  This was most likely a piece of insulation. No true corneal abrasion  Neck: Neck supple.    ED Course  Procedures (including critical care time)   The patient will be referred to opthalmology. The patient is advised to return here. Patients eye is feeling better since removal of debris. Eye was irrigated MDM          Carlyle Dolly, PA-C 04/09/12 1758

## 2012-04-29 ENCOUNTER — Other Ambulatory Visit (HOSPITAL_COMMUNITY): Payer: Self-pay | Admitting: Internal Medicine

## 2012-04-29 ENCOUNTER — Ambulatory Visit (HOSPITAL_COMMUNITY)
Admission: RE | Admit: 2012-04-29 | Discharge: 2012-04-29 | Disposition: A | Discharge status: Home / Self Care | Payer: BC Managed Care – PPO | Source: Ambulatory Visit | Attending: Internal Medicine | Admitting: Internal Medicine

## 2012-04-29 DIAGNOSIS — R1012 Left upper quadrant pain: Secondary | ICD-10-CM

## 2012-04-29 DIAGNOSIS — R109 Unspecified abdominal pain: Secondary | ICD-10-CM | POA: Insufficient documentation

## 2012-04-30 ENCOUNTER — Emergency Department (HOSPITAL_COMMUNITY)
Admission: EM | Admit: 2012-04-30 | Discharge: 2012-05-01 | Disposition: A | Discharge status: Home / Self Care | Payer: BC Managed Care – PPO | Attending: Emergency Medicine | Admitting: Emergency Medicine

## 2012-04-30 ENCOUNTER — Encounter (HOSPITAL_COMMUNITY): Payer: Self-pay

## 2012-04-30 DIAGNOSIS — Z8659 Personal history of other mental and behavioral disorders: Secondary | ICD-10-CM | POA: Insufficient documentation

## 2012-04-30 DIAGNOSIS — K859 Acute pancreatitis without necrosis or infection, unspecified: Secondary | ICD-10-CM | POA: Insufficient documentation

## 2012-04-30 DIAGNOSIS — M549 Dorsalgia, unspecified: Secondary | ICD-10-CM | POA: Insufficient documentation

## 2012-04-30 DIAGNOSIS — R110 Nausea: Secondary | ICD-10-CM | POA: Insufficient documentation

## 2012-04-30 DIAGNOSIS — J45909 Unspecified asthma, uncomplicated: Secondary | ICD-10-CM | POA: Insufficient documentation

## 2012-04-30 DIAGNOSIS — I10 Essential (primary) hypertension: Secondary | ICD-10-CM | POA: Insufficient documentation

## 2012-04-30 DIAGNOSIS — Z79899 Other long term (current) drug therapy: Secondary | ICD-10-CM | POA: Insufficient documentation

## 2012-04-30 DIAGNOSIS — R1013 Epigastric pain: Secondary | ICD-10-CM | POA: Insufficient documentation

## 2012-04-30 DIAGNOSIS — Z87891 Personal history of nicotine dependence: Secondary | ICD-10-CM | POA: Insufficient documentation

## 2012-04-30 HISTORY — DX: Acute pancreatitis without necrosis or infection, unspecified: K85.90

## 2012-04-30 LAB — CBC WITH DIFFERENTIAL/PLATELET
Hemoglobin: 12.7 g/dL — ABNORMAL LOW (ref 13.0–17.0)
Lymphs Abs: 2.3 10*3/uL (ref 0.7–4.0)
Monocytes Relative: 13 % — ABNORMAL HIGH (ref 3–12)
Neutro Abs: 4.2 10*3/uL (ref 1.7–7.7)
Neutrophils Relative %: 53 % (ref 43–77)
RBC: 4.46 MIL/uL (ref 4.22–5.81)

## 2012-04-30 LAB — COMPREHENSIVE METABOLIC PANEL
Albumin: 3.5 g/dL (ref 3.5–5.2)
Alkaline Phosphatase: 72 U/L (ref 39–117)
BUN: 15 mg/dL (ref 6–23)
Chloride: 102 mEq/L (ref 96–112)
GFR calc Af Amer: >90 mL/min (ref >90)
Glucose, Bld: 107 mg/dL — ABNORMAL HIGH (ref 70–99)
Potassium: 3.5 mEq/L (ref 3.5–5.1)
Total Bilirubin: 0.5 mg/dL (ref 0.3–1.2)

## 2012-04-30 LAB — URINALYSIS, ROUTINE W REFLEX MICROSCOPIC
Bilirubin Urine: NEGATIVE
Hgb urine dipstick: NEGATIVE
Ketones, ur: NEGATIVE mg/dL
Leukocytes, UA: NEGATIVE
pH: 7 (ref 5.0–8.0)

## 2012-04-30 LAB — LIPASE, BLOOD: Lipase: 55 U/L (ref 11–59)

## 2012-04-30 MED ORDER — SODIUM CHLORIDE 0.9 % IV BOLUS (SEPSIS)
1000.0000 mL | Freq: Once | INTRAVENOUS | Status: AC
  Administered 2012-04-30: 1000 mL via INTRAVENOUS

## 2012-04-30 MED ORDER — PANTOPRAZOLE SODIUM 40 MG IV SOLR
40.0000 mg | Freq: Once | INTRAVENOUS | Status: AC
  Administered 2012-04-30: 40 mg via INTRAVENOUS
  Filled 2012-04-30: qty 40

## 2012-04-30 MED ORDER — ONDANSETRON HCL 4 MG/2ML IJ SOLN
4.0000 mg | Freq: Once | INTRAMUSCULAR | Status: AC
  Administered 2012-04-30: 4 mg via INTRAVENOUS
  Filled 2012-04-30: qty 2

## 2012-04-30 MED ORDER — SODIUM CHLORIDE 0.9 % IV SOLN
Freq: Once | INTRAVENOUS | Status: AC
  Administered 2012-04-30: 23:00:00 via INTRAVENOUS

## 2012-04-30 MED ORDER — HYDROMORPHONE HCL PF 1 MG/ML IJ SOLN
1.0000 mg | Freq: Once | INTRAMUSCULAR | Status: AC
  Administered 2012-04-30: 1 mg via INTRAVENOUS
  Filled 2012-04-30: qty 1

## 2012-04-30 NOTE — ED Provider Notes (Signed)
History   This chart was scribed for Jeff Booze, MD by Sofie Rower, ED Scribe. The patient was seen in room [facility] and the patient's care was started at 11:03PM.    CSN: [id number]  Arrival date & time 04/30/12  2205   First MD Initiated Contact with Patient 04/30/12 2303      Chief Complaint  Patient presents with  . Pancreatitis    (Consider location/radiation/quality/duration/timing/severity/associated sxs/prior treatment) The history is provided by the patient. No language interpreter was used.    Jeff Buck is a 36 y.o. male , with a hx of hypertension, anxiety, and pancreatitis who presents to the Emergency Department complaining of sudden, progressively worsening, diffuse abdominal pain, onset two days ago (04/28/12).  Associated symptoms include nausea and back pain. The pt reports he was evaluated by his PCP two days ago, where which he had blood work performed, returning normal results. The pt was informed that if his symptoms progressed, he should seek evaluation at [facility], which he decided to follow up on this evening after experiencing a painful, exploding sensation within his abdomen while at the grocery store. The pt rates his abdominal pain at a 9/10 at present. Modifying factors include eating any types of foods which intensifies the abdominal pain.  The pt does not smoke or drink alcohol.   PCP is Dr. Jean Rosenthal.    Past Medical History  Diagnosis Date  . Hypertension   . Sinusitis   . Anxiety   . Pancreatitis     Past Surgical History  Procedure Date  . Orthopedic surgery     left knee surgery    Family History  Problem Relation Age of Onset  . Heart failure Other     History  Substance Use Topics  . Smoking status: Former Smoker    Quit date: 05/22/2010  . Smokeless tobacco: Current User    Types: Snuff  . Alcohol Use: No      Review of Systems  Gastrointestinal: Positive for nausea and abdominal pain.  Musculoskeletal: Positive for  back pain.    Allergies  Review of patient's allergies indicates no known allergies.  Home Medications   Current Outpatient Rx  Name  Route  Sig  Dispense  Refill  . OMEGA-3 FATTY ACIDS 1000 MG PO CAPS   Oral   Take 1 g by mouth 3 (three) times daily with meals.          Marland Kitchen GNP GINGKO BILOBA EXTRACT PO   Oral   Take 2 tablets by mouth daily.          Marland Kitchen LISINOPRIL 20 MG PO TABS   Oral   Take 20 mg by mouth daily.         . PSYLLIUM 58.6 % PO PACK   Oral   Take 1 packet by mouth daily.         Marland Kitchen UNABLE TO FIND   Topical   Apply 1 application topically daily as needed. For muscle aches and pain.Med Name: Horse Liniment (anti-Inflammatory)           BP 129/73  Pulse 76  Temp 98.2 F (36.8 C) (Oral)  Resp 20  Ht 5\' 11"  (1.803 m)  Wt 230 lb (104.327 kg)  BMI 32.08 kg/m2  SpO2 100%  Physical Exam  Nursing note and vitals reviewed. Constitutional: He is oriented to person, place, and time. He appears well-developed and well-nourished.  HENT:  Head: Atraumatic.  Nose: Nose normal.  Eyes: Conjunctivae normal  and EOM are normal. No scleral icterus.  Neck: Normal range of motion. Neck supple.  Cardiovascular: Normal rate, regular rhythm and normal heart sounds.   Pulmonary/Chest: Effort normal and breath sounds normal. No respiratory distress.  Abdominal: Soft. There is tenderness.       Epigastric tenderness, bowel sounds decreased.   Musculoskeletal: Normal range of motion.  Neurological: He is alert and oriented to person, place, and time.  Skin: Skin is warm and dry. He is not diaphoretic.  Psychiatric: He has a normal mood and affect. His behavior is normal.    ED Course  Procedures (including critical care time)  DIAGNOSTIC STUDIES: Oxygen Saturation is 100% on room air, normal by my interpretation.    COORDINATION OF CARE:  11:10 PM- Treatment plan concerning causes of pancreatitis, nausea management, and pain management discussed with patient.  Pt agrees with treatment.    Results for orders placed during the hospital encounter of 04/30/12  CBC WITH DIFFERENTIAL      Component Value Range   WBC 7.8  4.0 - 10.5 K/uL   RBC 4.46  4.22 - 5.81 MIL/uL   Hemoglobin 12.7 (*) 13.0 - 17.0 g/dL   HCT 16.1 (*) 09.6 - 04.5 %   MCV 83.0  78.0 - 100.0 fL   MCH 28.5  26.0 - 34.0 pg   MCHC 34.3  30.0 - 36.0 g/dL   RDW 40.9  81.1 - 91.4 %   Platelets 321  150 - 400 K/uL   Neutrophils Relative 53  43 - 77 %   Neutro Abs 4.2  1.7 - 7.7 K/uL   Lymphocytes Relative 29  12 - 46 %   Lymphs Abs 2.3  0.7 - 4.0 K/uL   Monocytes Relative 13 (*) 3 - 12 %   Monocytes Absolute 1.0  0.1 - 1.0 K/uL   Eosinophils Relative 5  0 - 5 %   Eosinophils Absolute 0.4  0.0 - 0.7 K/uL   Basophils Relative 0  0 - 1 %   Basophils Absolute 0.0  0.0 - 0.1 K/uL  COMPREHENSIVE METABOLIC PANEL      Component Value Range   Sodium 137  135 - 145 mEq/L   Potassium 3.5  3.5 - 5.1 mEq/L   Chloride 102  96 - 112 mEq/L   CO2 27  19 - 32 mEq/L   Glucose, Bld 107 (*) 70 - 99 mg/dL   BUN 15  6 - 23 mg/dL   Creatinine, Ser 7.82  0.50 - 1.35 mg/dL   Calcium 8.8  8.4 - 95.6 mg/dL   Total Protein 7.2  6.0 - 8.3 g/dL   Albumin 3.5  3.5 - 5.2 g/dL   AST 22  0 - 37 U/L   ALT 25  0 - 53 U/L   Alkaline Phosphatase 72  39 - 117 U/L   Total Bilirubin 0.5  0.3 - 1.2 mg/dL   GFR calc non Af Amer >90  >90 mL/min   GFR calc Af Amer >90  >90 mL/min  LIPASE, BLOOD      Component Value Range   Lipase 55  11 - 59 U/L  URINALYSIS, ROUTINE W REFLEX MICROSCOPIC      Component Value Range   Color, Urine YELLOW  YELLOW   APPearance CLEAR  CLEAR   Specific Gravity, Urine 1.010  1.005 - 1.030   pH 7.0  5.0 - 8.0   Glucose, UA NEGATIVE  NEGATIVE mg/dL   Hgb urine dipstick NEGATIVE  NEGATIVE   Bilirubin Urine NEGATIVE  NEGATIVE   Ketones, ur NEGATIVE  NEGATIVE mg/dL   Protein, ur NEGATIVE  NEGATIVE mg/dL   Urobilinogen, UA 0.2  0.0 - 1.0 mg/dL   Nitrite NEGATIVE  NEGATIVE   Leukocytes,  UA NEGATIVE  NEGATIVE   Ct Abdomen Pelvis Wo Contrast  04/29/2012  *RADIOLOGY REPORT*  Clinical Data: Left-sided abdominal and flank pain.  Urolithiasis.  CT ABDOMEN AND PELVIS WITHOUT CONTRAST  Technique:  Multidetector CT imaging of the abdomen and pelvis was performed following the standard protocol without intravenous contrast.  Comparison: None.  Findings: No evidence of renal calculi or hydronephrosis.  No evidence of ureteral calculi or dilatation.  No bladder calculi identified.  There is mild swelling and adjacent soft tissue stranding involving the pancreatic tail, consistent with mild pancreatitis.  No definite mass identified on this noncontrast study.  The other abdominal parenchymal organs are normal appearance.  Gallbladder is unremarkable.  Normal appendix visualized.  No other inflammatory process or abnormal fluid collections seen.  No evidence of dilated bowel loops or hernia.  IMPRESSION:  1.  Findings consistent with mild acute pancreatitis involving the pancreatic tail. 2.  No evidence of urolithiasis or other significant findings.   Original Report Authenticated By: Myles Rosenthal, M.D.      1. Pancreatitis   2. Asthma       MDM  Abdominal pain secondary to pancreatitis. His old records are reviewed, and he had a CT scan showing a pancreatitis involving the tail of the pancreas. He'll be given IV fluids, IV hydromorphone, IV ondansetron and reassessed.  He got good relief with the above-noted treatment. He had also received IV pantoprazole. However, he started feeling short of breath and states that he has had problems with asthma in the past for which she generally used an inhaler. On exam, he now had a prolonged exhalation phase without any overt wheezes. He was given albuterol with Atrovent with good relief of the dyspnea. At this point, his abdominal pain was starting to return, she is given an additional dose of hydromorphone and ondansetron. He felt that he was doing well  enough to go home. He is discharged with prescriptions for Percocet, metoclopramide, pantoprazole, and albuterol inhaler.      I personally performed the services described in this documentation, which was scribed in my presence. The recorded information has been reviewed and is accurate.      Jeff Booze, MD 05/01/12 [phone]

## 2012-04-30 NOTE — ED Notes (Signed)
Have an inflamed pancrease and having a lot of pain per pt.

## 2012-05-01 MED ORDER — IPRATROPIUM BROMIDE 0.02 % IN SOLN
0.5000 mg | Freq: Once | RESPIRATORY_TRACT | Status: AC
  Administered 2012-05-01: 0.5 mg via RESPIRATORY_TRACT
  Filled 2012-05-01: qty 2.5

## 2012-05-01 MED ORDER — HYDROMORPHONE HCL PF 1 MG/ML IJ SOLN
1.0000 mg | Freq: Once | INTRAMUSCULAR | Status: AC
  Administered 2012-05-01: 1 mg via INTRAVENOUS
  Filled 2012-05-01: qty 1

## 2012-05-01 MED ORDER — ALBUTEROL SULFATE HFA 108 (90 BASE) MCG/ACT IN AERS: 2.0000 | INHALATION_SPRAY | RESPIRATORY_TRACT | Status: DC | PRN

## 2012-05-01 MED ORDER — ONDANSETRON HCL 4 MG/2ML IJ SOLN
4.0000 mg | Freq: Once | INTRAMUSCULAR | Status: AC
Start: 2012-05-01 — End: 2012-05-01
  Administered 2012-05-01: 4 mg via INTRAVENOUS
  Filled 2012-05-01: qty 2

## 2012-05-01 MED ORDER — OXYCODONE-ACETAMINOPHEN 5-325 MG PO TABS: 1.0000 | ORAL_TABLET | ORAL | Status: AC | PRN

## 2012-05-01 MED ORDER — PANTOPRAZOLE SODIUM 40 MG PO TBEC: 40.0000 mg | DELAYED_RELEASE_TABLET | Freq: Every day | ORAL | Status: DC

## 2012-05-01 MED ORDER — METOCLOPRAMIDE HCL 10 MG PO TABS: 10.0000 mg | ORAL_TABLET | Freq: Four times a day (QID) | ORAL | Status: DC | PRN

## 2012-05-01 MED ORDER — ALBUTEROL SULFATE (5 MG/ML) 0.5% IN NEBU
2.5000 mg | INHALATION_SOLUTION | Freq: Once | RESPIRATORY_TRACT | Status: AC
  Administered 2012-05-01: 2.5 mg via RESPIRATORY_TRACT
  Filled 2012-05-01: qty 0.5

## 2012-05-07 MED FILL — Oxycodone w/ Acetaminophen Tab 5-325 MG: ORAL | Qty: 6 | Status: AC

## 2012-06-07 ENCOUNTER — Encounter (HOSPITAL_COMMUNITY): Payer: Self-pay | Admitting: *Deleted

## 2012-06-07 ENCOUNTER — Emergency Department (HOSPITAL_COMMUNITY): Payer: BC Managed Care – PPO

## 2012-06-07 ENCOUNTER — Emergency Department (HOSPITAL_COMMUNITY)
Admission: EM | Admit: 2012-06-07 | Discharge: 2012-06-07 | Disposition: A | Discharge status: Home / Self Care | Payer: BC Managed Care – PPO | Attending: Emergency Medicine | Admitting: Emergency Medicine

## 2012-06-07 DIAGNOSIS — Z79899 Other long term (current) drug therapy: Secondary | ICD-10-CM | POA: Insufficient documentation

## 2012-06-07 DIAGNOSIS — Z8719 Personal history of other diseases of the digestive system: Secondary | ICD-10-CM | POA: Insufficient documentation

## 2012-06-07 DIAGNOSIS — Z8659 Personal history of other mental and behavioral disorders: Secondary | ICD-10-CM | POA: Insufficient documentation

## 2012-06-07 DIAGNOSIS — Z87891 Personal history of nicotine dependence: Secondary | ICD-10-CM | POA: Insufficient documentation

## 2012-06-07 DIAGNOSIS — M545 Low back pain, unspecified: Secondary | ICD-10-CM | POA: Insufficient documentation

## 2012-06-07 DIAGNOSIS — R109 Unspecified abdominal pain: Secondary | ICD-10-CM

## 2012-06-07 DIAGNOSIS — Z8709 Personal history of other diseases of the respiratory system: Secondary | ICD-10-CM | POA: Insufficient documentation

## 2012-06-07 DIAGNOSIS — I10 Essential (primary) hypertension: Secondary | ICD-10-CM | POA: Insufficient documentation

## 2012-06-07 DIAGNOSIS — R110 Nausea: Secondary | ICD-10-CM | POA: Insufficient documentation

## 2012-06-07 LAB — CBC
MCHC: 34.2 g/dL (ref 30.0–36.0)
RDW: 12.8 % (ref 11.5–15.5)
WBC: 6 10*3/uL (ref 4.0–10.5)

## 2012-06-07 LAB — BASIC METABOLIC PANEL
GFR calc Af Amer: >90 mL/min (ref >90)
GFR calc non Af Amer: >90 mL/min (ref >90)
Potassium: 3.9 mEq/L (ref 3.5–5.1)
Sodium: 140 mEq/L (ref 135–145)

## 2012-06-07 LAB — URINALYSIS, ROUTINE W REFLEX MICROSCOPIC
Ketones, ur: NEGATIVE mg/dL
Leukocytes, UA: NEGATIVE
Nitrite: NEGATIVE
Protein, ur: NEGATIVE mg/dL
pH: 6 (ref 5.0–8.0)

## 2012-06-07 MED ORDER — HYDROCODONE-ACETAMINOPHEN 5-325 MG PO TABS: 2.0000 | ORAL_TABLET | ORAL | Status: DC | PRN

## 2012-06-07 MED ORDER — ONDANSETRON HCL 4 MG/2ML IJ SOLN
4.0000 mg | Freq: Once | INTRAMUSCULAR | Status: AC
  Administered 2012-06-07: 4 mg via INTRAVENOUS
  Filled 2012-06-07: qty 2

## 2012-06-07 MED ORDER — HYDROMORPHONE HCL PF 1 MG/ML IJ SOLN
1.0000 mg | Freq: Once | INTRAMUSCULAR | Status: AC
  Administered 2012-06-07: 1 mg via INTRAVENOUS
  Filled 2012-06-07: qty 1

## 2012-06-07 NOTE — ED Notes (Signed)
Pt d/c home in NAD. Pt voiced understanding of d/c instructions. Pt states he was getting ride home from friend. Pt instructed not to drive after taking narcotic pain medication.

## 2012-06-07 NOTE — ED Provider Notes (Signed)
Medical screening examination/treatment/procedure(s) were performed by non-physician practitioner and as supervising physician I was immediately available for consultation/collaboration.   Lyanne Co, MD 06/07/12 1328

## 2012-06-07 NOTE — ED Notes (Signed)
Pt transported to [facility]

## 2012-06-07 NOTE — ED Provider Notes (Signed)
History     CSN: [id number]  Arrival date & time 06/07/12  0908   First MD Initiated Contact with Patient 06/07/12 [phone]      Chief Complaint  Patient presents with  . Groin Pain  . Back Pain  . Nausea    (Consider location/radiation/quality/duration/timing/severity/associated sxs/prior treatment) HPI  37 year old male with history of hypertension, anxiety, and Pancreatitis presents complaining of back pain and testicular pain. Patient reports 3 weeks ago he had a brief episode of pain to his left testicle and left lower back which only lasting for seconds and resolved without any specific treatment. This morning while sitting he experiencing an acute onset of pain to his left low back, left low abdomen and also pain to his left testicle. Pain from all 3 location started at the same time.  Unsure if it's radiating pain.  Describe pain as a sharp and throbbing sensation, persistent, moderate in severity rating as a 9/10, nothing seems to make it better or worse. He has had prior history of kidney stones but states that this pain felt different. He endorsed nausea without vomiting or diarrhea. He denies fever, chills, chest pain, shortness of breath, dysuria, hematuria. He denies any testicle swelling of scrotal swelling. He denies any rash.  Although patient has a history of pancreatitis, he denies history of diabetes no history of alcohol abuse. He reports he has been watching his diet, avoid all alcohol to decrease risk of developing pancreatitis.  Past Medical History  Diagnosis Date  . Hypertension   . Sinusitis   . Anxiety   . Pancreatitis     Past Surgical History  Procedure Date  . Orthopedic surgery     left knee surgery    Family History  Problem Relation Age of Onset  . Heart failure Other     History  Substance Use Topics  . Smoking status: Former Smoker    Quit date: 05/22/2010  . Smokeless tobacco: Current User    Types: Snuff  . Alcohol Use: No       Review of Systems  Constitutional:       A complete 10 system review of systems was obtained and all systems are negative except as noted in the HPI and PMH.    Allergies  Review of patient's allergies indicates no known allergies.  Home Medications     There were no vitals taken for this visit.  Physical Exam  Nursing note and vitals reviewed. Constitutional: He is oriented to person, place, and time. He appears well-developed and well-nourished. He appears distressed (appears uncomfortable, moving around the room).  HENT:  Head: Normocephalic and atraumatic.  Eyes: Conjunctivae normal are normal.  Neck: Neck supple.  Cardiovascular: Normal rate and regular rhythm.  Exam reveals no gallop and no friction rub.   No murmur heard. Pulmonary/Chest: Effort normal. No respiratory distress. He has no wheezes.  Abdominal: Soft. There is tenderness (tenderness to left suprapubic region without hernia noted or overlying skin changes.  No guarding or rebound tenderness.).       Mild L CVA tenderness  Genitourinary:       Chaperone present:  Tenderness to L testicle on palpation.  No scrotal swelling, testes with normal lie, no rash, no abscess noted.  Pt is circumcised  Musculoskeletal: He exhibits no edema and no tenderness.  Neurological: He is alert and oriented to person, place, and time.  Skin: Skin is warm. No rash noted.  Psychiatric: He has a normal mood and affect.  ED Course  Procedures (including critical care time)  Results for orders placed during the hospital encounter of 06/07/12  URINALYSIS, ROUTINE W REFLEX MICROSCOPIC      Component Value Range   Color, Urine YELLOW  YELLOW   APPearance CLEAR  CLEAR   Specific Gravity, Urine 1.012  1.005 - 1.030   pH 6.0  5.0 - 8.0   Glucose, UA NEGATIVE  NEGATIVE mg/dL   Hgb urine dipstick NEGATIVE  NEGATIVE   Bilirubin Urine NEGATIVE  NEGATIVE   Ketones, ur NEGATIVE  NEGATIVE mg/dL   Protein, ur NEGATIVE   NEGATIVE mg/dL   Urobilinogen, UA 0.2  0.0 - 1.0 mg/dL   Nitrite NEGATIVE  NEGATIVE   Leukocytes, UA NEGATIVE  NEGATIVE  CBC      Component Value Range   WBC 6.0  4.0 - 10.5 K/uL   RBC 4.80  4.22 - 5.81 MIL/uL   Hemoglobin 13.3  13.0 - 17.0 g/dL   HCT 16.1 (*) 09.6 - 04.5 %   MCV 81.0  78.0 - 100.0 fL   MCH 27.7  26.0 - 34.0 pg   MCHC 34.2  30.0 - 36.0 g/dL   RDW 40.9  81.1 - 91.4 %   Platelets 292  150 - 400 K/uL  BASIC METABOLIC PANEL      Component Value Range   Sodium 140  135 - 145 mEq/L   Potassium 3.9  3.5 - 5.1 mEq/L   Chloride 104  96 - 112 mEq/L   CO2 27  19 - 32 mEq/L   Glucose, Bld 100 (*) 70 - 99 mg/dL   BUN 15  6 - 23 mg/dL   Creatinine, Ser 7.82  0.50 - 1.35 mg/dL   Calcium 9.3  8.4 - 95.6 mg/dL   GFR calc non Af Amer >90  >90 mL/min   GFR calc Af Amer >90  >90 mL/min   Ct Abdomen Pelvis Wo Contrast  06/07/2012  *RADIOLOGY REPORT*  Clinical Data: Groin pain and nausea.  Evaluate for kidney stone.  CT ABDOMEN AND PELVIS WITHOUT CONTRAST  Technique:  Multidetector CT imaging of the abdomen and pelvis was performed following the standard protocol without intravenous contrast.  Comparison: 04/29/2012  Findings: Lung bases are clear.  No evidence for free air.  No gross abnormality to the liver, gallbladder, spleen and pancreas. Inflammation around the pancreas tail has resolved since the prior examination.  Mild nodularity of the pancreatic tail is unchanged and could represent normal parenchyma.  Normal appearance of both adrenal glands.  No evidence of kidney stones or hydronephrosis.  The appendix has a retrocecal location without inflammation.  There is stool throughout the colon.  No gross abnormality to the urinary bladder or prostate.  There is no acute bony abnormality.  Difficult to exclude mild thickening in the distal esophagus.  IMPRESSION: No acute abnormalities within the abdomen or pelvis.  The inflammation around the pancreatic tail has resolved since the prior  CT examination.  The pancreas tail has a nodular or lobulated appearance which is similar to the previous examination and this could represent normal parenchyma.  No evidence for kidney stones.   Original Report Authenticated By: Richarda Overlie, M.D.    US Scrotum  06/07/2012  *RADIOLOGY REPORT*  Clinical Data: Rule out testicular torsion  SCROTAL ULTRASOUND DOPPLER ULTRASOUND OF THE TESTICLES  Technique:  Complete ultrasound examination of the testicles, epididymis, and other scrotal structures was performed.  Color and spectral Doppler ultrasound were also utilized to evaluate  blood flow to the testicles.  Comparison:  None.  Findings:  The testicles are symmetric in size and echogenicity. No testicular masses are seen, and there is no evidence of microlithiasis.  Both epididymal heads are unremarkable in appearance.  There is no evidence of hydrocele, varicocele, or other extra-testicular abnormality.  Blood flow is seen within both testicles on color Doppler sonography.  Doppler spectral waveforms show both arterial and venous flow signal in both testicles.  IMPRESSION: Negative.  No evidence of testicular mass or torsion.   Original Report Authenticated By: Signa Kell, M.D.    [place] Art/ven Flow Abd Pelv Doppler  06/07/2012  *RADIOLOGY REPORT*  Clinical Data: Rule out testicular torsion  SCROTAL ULTRASOUND DOPPLER ULTRASOUND OF THE TESTICLES  Technique:  Complete ultrasound examination of the testicles, epididymis, and other scrotal structures was performed.  Color and spectral Doppler ultrasound were also utilized to evaluate blood flow to the testicles.  Comparison:  None.  Findings:  The testicles are symmetric in size and echogenicity. No testicular masses are seen, and there is no evidence of microlithiasis.  Both epididymal heads are unremarkable in appearance.  There is no evidence of hydrocele, varicocele, or other extra-testicular abnormality.  Blood flow is seen within both testicles on color Doppler  sonography.  Doppler spectral waveforms show both arterial and venous flow signal in both testicles.  IMPRESSION: Negative.  No evidence of testicular mass or torsion.   Original Report Authenticated By: Signa Kell, M.D.      1. Left flank pain  MDM  Pt presents with pain to L flank down to L testicle.  Pt appears uncomfortable, suggestive of kidney stone.  Has prior kidney stone in the past.  Story less likely concerning for testicular torsion.  Will give pain meds, check UA.  If UA unremarkable, will consider scrotal US and abd CT for further evaluation.  Care discussed with attending.  11:39 AM UA unremarkable.  Abd/pelvic CT w/o CM is unremarkable.  No evidence of kidney stones or pancreatitis.  Pt continues to endorse pain.  Pain medication given, will obtain US of scrotum to r/o torsion, or infection.    1:20 PM Scrotal US shows no acute finding.  Pt still have mild tenderness to L groin and L testicle on reexamination, however in light of negative work up here, along with being afebrile and have stable VS.  I recommend pt to fu with his PCP for further evaluation.  Pain medication prescribed.  Pt voice understanding and agrees with plan.  Strict return precaution.    BP 120/92  Pulse 62  Temp 98.4 F (36.9 C) (Oral)  Resp 18  Ht 5\' 11"  (1.803 m)  Wt 220 lb (99.791 kg)  BMI 30.68 kg/m2  SpO2 96%  I have reviewed nursing notes and vital signs. I personally reviewed the imaging tests through PACS system  I reviewed available ER/hospitalization records thought the EMR   Fayrene Helper, PA-C 06/07/12 1322

## 2012-06-07 NOTE — ED Notes (Signed)
Patient reports increasing pain in his left groin/back and left testicle for the past 3 weeks,  He states the pain has been intermittent but has increased.  He has increased pain today.  He states he is feeling nauseated today.  Denies any diff voiding

## 2012-06-12 ENCOUNTER — Encounter (INDEPENDENT_AMBULATORY_CARE_PROVIDER_SITE_OTHER): Payer: Self-pay | Admitting: *Deleted

## 2012-06-17 ENCOUNTER — Ambulatory Visit (INDEPENDENT_AMBULATORY_CARE_PROVIDER_SITE_OTHER): Payer: Self-pay | Admitting: Internal Medicine

## 2012-07-02 IMAGING — CR DG CHEST 2V
2 series · 2 of 2 positions shown · non-contrast
Comparison: 02/10/2011

CLINICAL DATA: Chest pain

CHEST - 2 VIEW

[view not recorded (1 of 2)]
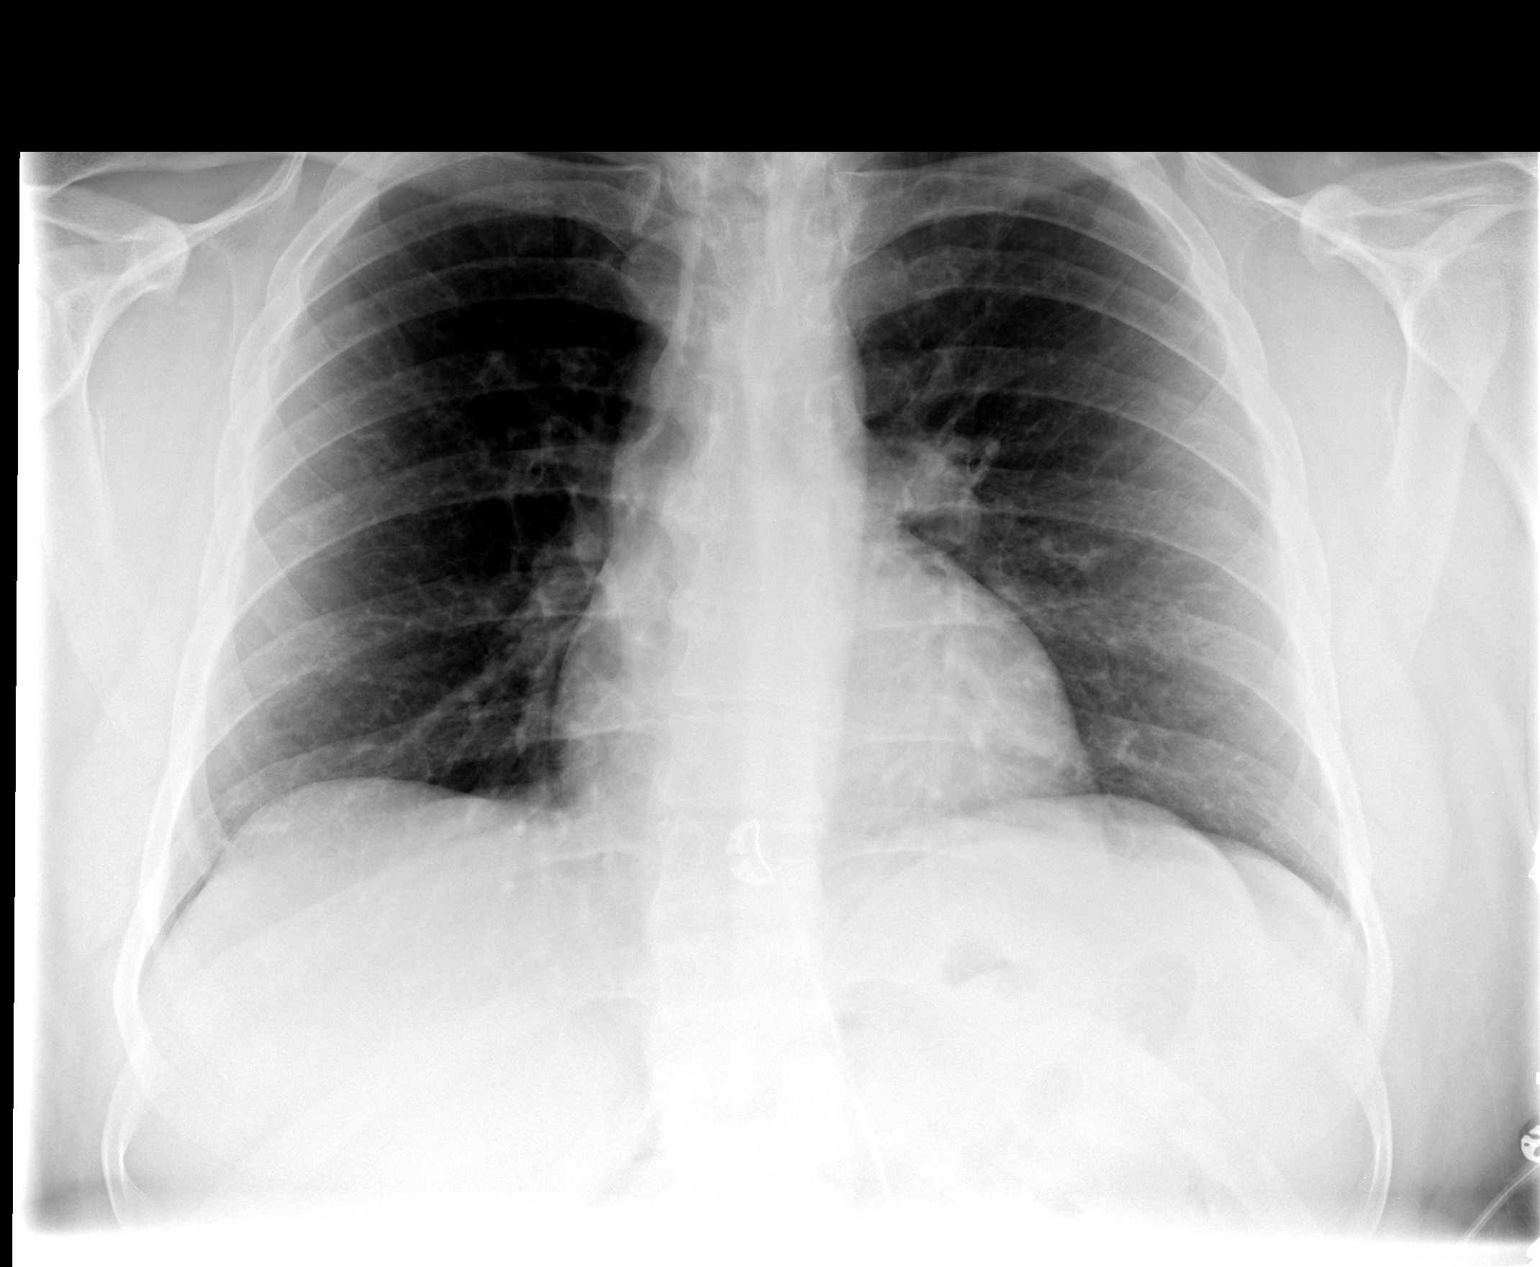

[view not recorded (2 of 2)]
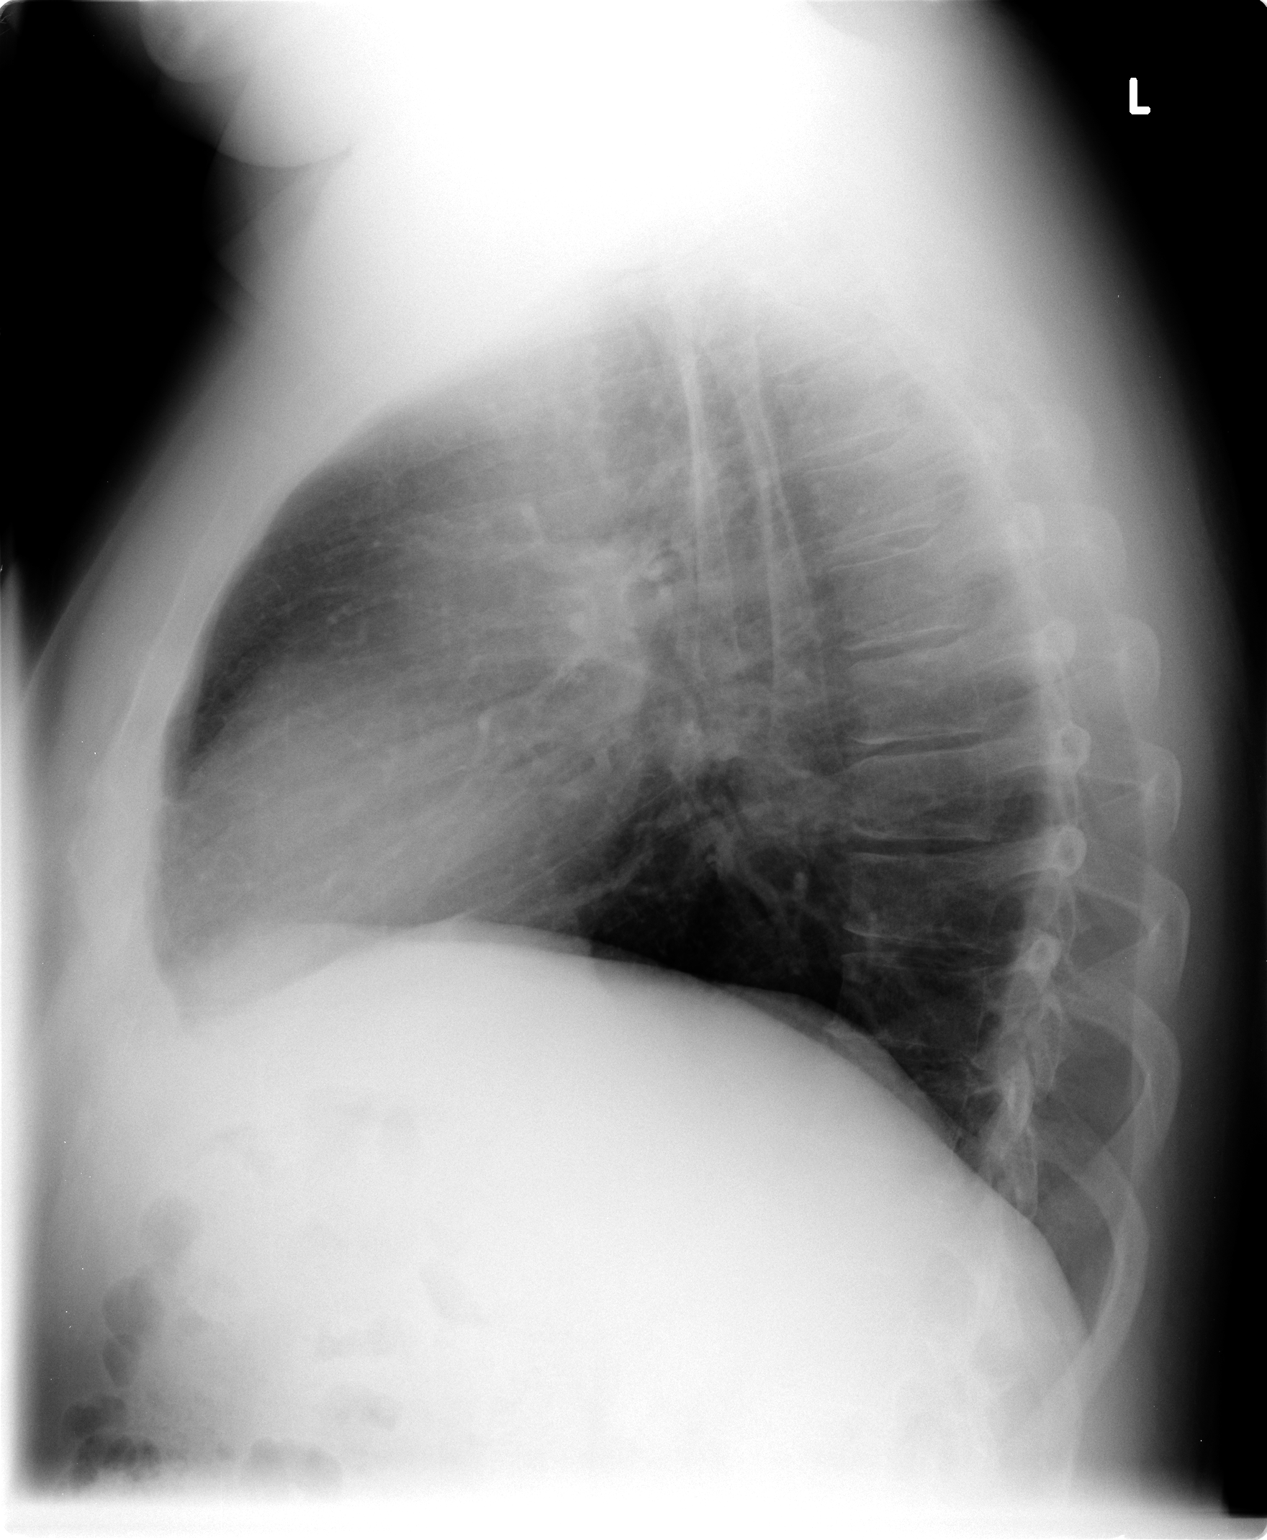

[2 of 2 positions shown; findings below may reference images not displayed]

FINDINGS: Lungs are clear. No pleural effusion or pneumothorax.

Cardiomediastinal silhouette is within normal limits.

Visualized osseous structures are within normal limits.
IMPRESSION: No evidence of acute cardiopulmonary disease.

## 2012-12-07 ENCOUNTER — Encounter (HOSPITAL_COMMUNITY): Payer: Self-pay | Admitting: *Deleted

## 2012-12-07 ENCOUNTER — Emergency Department (HOSPITAL_COMMUNITY)
Admission: EM | Admit: 2012-12-07 | Discharge: 2012-12-07 | Disposition: A | Discharge status: Home / Self Care | Payer: BC Managed Care – PPO | Attending: Emergency Medicine | Admitting: Emergency Medicine

## 2012-12-07 DIAGNOSIS — R21 Rash and other nonspecific skin eruption: Secondary | ICD-10-CM | POA: Insufficient documentation

## 2012-12-07 DIAGNOSIS — Y929 Unspecified place or not applicable: Secondary | ICD-10-CM | POA: Insufficient documentation

## 2012-12-07 DIAGNOSIS — Z79899 Other long term (current) drug therapy: Secondary | ICD-10-CM | POA: Insufficient documentation

## 2012-12-07 DIAGNOSIS — I10 Essential (primary) hypertension: Secondary | ICD-10-CM | POA: Insufficient documentation

## 2012-12-07 DIAGNOSIS — Z8659 Personal history of other mental and behavioral disorders: Secondary | ICD-10-CM | POA: Insufficient documentation

## 2012-12-07 DIAGNOSIS — W57XXXA Bitten or stung by nonvenomous insect and other nonvenomous arthropods, initial encounter: Secondary | ICD-10-CM | POA: Insufficient documentation

## 2012-12-07 DIAGNOSIS — S30860A Insect bite (nonvenomous) of lower back and pelvis, initial encounter: Secondary | ICD-10-CM | POA: Insufficient documentation

## 2012-12-07 DIAGNOSIS — Z8709 Personal history of other diseases of the respiratory system: Secondary | ICD-10-CM | POA: Insufficient documentation

## 2012-12-07 DIAGNOSIS — Z8719 Personal history of other diseases of the digestive system: Secondary | ICD-10-CM | POA: Insufficient documentation

## 2012-12-07 DIAGNOSIS — Z87891 Personal history of nicotine dependence: Secondary | ICD-10-CM | POA: Insufficient documentation

## 2012-12-07 DIAGNOSIS — Y9389 Activity, other specified: Secondary | ICD-10-CM | POA: Insufficient documentation

## 2012-12-07 MED ORDER — DOXYCYCLINE HYCLATE 100 MG PO TABS: 100.0000 mg | ORAL_TABLET | Freq: Two times a day (BID) | ORAL | Status: DC

## 2012-12-07 NOTE — ED Provider Notes (Signed)
History    CSN: [id number] Arrival date & time 12/07/12  Jeff Buck  First MD Initiated Contact with Patient 12/07/12 1832     Chief Complaint  Patient presents with  . Insect Bite    HPI Pt was seen at 1855.  Per pt, c/o gradual onset and persistence of constant red rash to left upper back area that he noticed today. 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 States he pulled a tick off the same area as the rash approx 8 days ago. Denies fevers, no other areas of rash, no CP/SOB, no joints pain.   Past Medical History  Diagnosis Date  . Hypertension   . Sinusitis   . Anxiety   . Pancreatitis    Past Surgical History  Procedure Laterality Date  . Orthopedic surgery      left knee surgery   Family History  Problem Relation Age of Onset  . Heart failure Other    History  Substance Use Topics  . Smoking status: Former Smoker    Quit date: 05/22/2010  . Smokeless tobacco: Current User    Types: Snuff  . Alcohol Use: No    Review of Systems ROS: Statement: All systems negative except as marked or noted in the HPI; Constitutional: Negative for fever and chills. ; ; Eyes: Negative for eye pain, redness and discharge. ; ; ENMT: Negative for ear pain, hoarseness, nasal congestion, sinus pressure and sore throat. ; ; Cardiovascular: Negative for chest pain, palpitations, diaphoresis, dyspnea and peripheral edema. ; ; Respiratory: Negative for cough, wheezing and stridor. ; ; Gastrointestinal: Negative for nausea, vomiting, diarrhea, abdominal pain, blood in stool, hematemesis, jaundice and rectal bleeding. . ; ; Genitourinary: Negative for dysuria, flank pain and hematuria. ; ; Musculoskeletal: Negative for back pain and neck pain. Negative for swelling and trauma.; ; Skin: +rash. Negative for pruritus, abrasions, blisters, bruising and skin lesion.; ; Neuro: Negative for headache, lightheadedness and neck stiffness. Negative for weakness, altered level of consciousness  , altered mental status, extremity weakness, paresthesias, involuntary movement, seizure and syncope.       Allergies  Review of patient's allergies indicates no known allergies.  Home Medications   Current Outpatient Rx  Name  Route  Sig  Dispense  Refill  . lisinopril (PRINIVIL,ZESTRIL) 20 MG tablet   Oral   Take 20 mg by mouth daily.         . psyllium (METAMUCIL) 58.6 % packet   Oral   Take 1 packet by mouth daily.         Marland Kitchen doxycycline (VIBRA-TABS) 100 MG tablet   Oral   Take 1 tablet (100 mg total) by mouth 2 (two) times daily.   28 tablet   0   . UNABLE TO FIND   Topical   Apply 1 application topically daily as needed. For muscle aches and pain.Med Name: Horse Liniment (anti-Inflammatory)          BP 134/86  Pulse 83  Temp(Src) 98.4 F (36.9 C) (Oral)  Resp 20  Ht 5\' 11"  (1.803 m)  Wt 225 lb (102.059 kg)  BMI 31.39 kg/m2  SpO2 99% Physical Exam 1900: Physical examination:  Nursing notes reviewed; Vital signs and O2 SAT reviewed;  Constitutional: Well developed, Well nourished, Well hydrated, In no acute distress; Head:  Normocephalic, atraumatic; Eyes: EOMI, PERRL, No scleral icterus; ENMT: Mouth and pharynx normal, Mucous membranes moist; Neck: Supple, Full range of motion, No lymphadenopathy; Cardiovascular: Regular rate  and rhythm, No murmur, rub, or gallop; Respiratory: Breath sounds clear & equal bilaterally, No rales, rhonchi, wheezes.  Speaking full sentences with ease, Normal respiratory effort/excursion; Chest: Nontender, Movement normal; Abdomen: Soft, Nontender, Nondistended, Normal bowel sounds; Genitourinary: No CVA tenderness; Extremities: Pulses normal, No tenderness, No edema, No calf edema or asymmetry.; Neuro: AA&Ox3, Major CN grossly intact.  Speech clear. No gross focal motor or sensory deficits in extremities.; Skin: Color normal, Warm, Dry, +"bullseye rash" to left scapular area.    ED Course  Procedures     MDM  MDM Reviewed:  nursing note and vitals   1905:   Pt works outside, exposed regularly to ticks. Now with "bullseye rash" to left back. Will tx with doxycycline. Dx d/w pt.  Questions answered.  Verb understanding, agreeable to d/c home with outpt f/u.   Laray Anger, DO 12/09/12 1729

## 2012-12-07 NOTE — ED Notes (Signed)
Pt has a red circular area on back where he was bitten by a tick. Wants to be checked out. Denies any symptoms.

## 2012-12-09 ENCOUNTER — Emergency Department (HOSPITAL_COMMUNITY)
Admission: EM | Admit: 2012-12-09 | Discharge: 2012-12-10 | Disposition: A | Discharge status: Home / Self Care | Payer: BC Managed Care – PPO | Attending: Emergency Medicine | Admitting: Emergency Medicine

## 2012-12-09 ENCOUNTER — Encounter (HOSPITAL_COMMUNITY): Payer: Self-pay | Admitting: *Deleted

## 2012-12-09 DIAGNOSIS — Z8709 Personal history of other diseases of the respiratory system: Secondary | ICD-10-CM | POA: Insufficient documentation

## 2012-12-09 DIAGNOSIS — R062 Wheezing: Secondary | ICD-10-CM | POA: Insufficient documentation

## 2012-12-09 DIAGNOSIS — Z87891 Personal history of nicotine dependence: Secondary | ICD-10-CM | POA: Insufficient documentation

## 2012-12-09 DIAGNOSIS — T364X5A Adverse effect of tetracyclines, initial encounter: Secondary | ICD-10-CM | POA: Insufficient documentation

## 2012-12-09 DIAGNOSIS — Z8659 Personal history of other mental and behavioral disorders: Secondary | ICD-10-CM | POA: Insufficient documentation

## 2012-12-09 DIAGNOSIS — I10 Essential (primary) hypertension: Secondary | ICD-10-CM | POA: Insufficient documentation

## 2012-12-09 DIAGNOSIS — R21 Rash and other nonspecific skin eruption: Secondary | ICD-10-CM | POA: Insufficient documentation

## 2012-12-09 DIAGNOSIS — R220 Localized swelling, mass and lump, head: Secondary | ICD-10-CM | POA: Insufficient documentation

## 2012-12-09 DIAGNOSIS — Z8719 Personal history of other diseases of the digestive system: Secondary | ICD-10-CM | POA: Insufficient documentation

## 2012-12-09 DIAGNOSIS — T7840XA Allergy, unspecified, initial encounter: Secondary | ICD-10-CM

## 2012-12-09 MED ORDER — DIPHENHYDRAMINE HCL 50 MG/ML IJ SOLN
25.0000 mg | Freq: Once | INTRAMUSCULAR | Status: AC
  Administered 2012-12-09: 25 mg via INTRAVENOUS
  Filled 2012-12-09: qty 1

## 2012-12-09 MED ORDER — SODIUM CHLORIDE 0.9 % IV BOLUS (SEPSIS)
1000.0000 mL | Freq: Once | INTRAVENOUS | Status: AC
  Administered 2012-12-09: 1000 mL via INTRAVENOUS

## 2012-12-09 MED ORDER — FAMOTIDINE IN NACL 20-0.9 MG/50ML-% IV SOLN
20.0000 mg | Freq: Once | INTRAVENOUS | Status: AC
  Administered 2012-12-09: 20 mg via INTRAVENOUS
  Filled 2012-12-09: qty 50

## 2012-12-09 MED ORDER — METHYLPREDNISOLONE SODIUM SUCC 125 MG IJ SOLR
125.0000 mg | Freq: Once | INTRAMUSCULAR | Status: AC
  Administered 2012-12-09: 125 mg via INTRAVENOUS
  Filled 2012-12-09: qty 2

## 2012-12-09 NOTE — ED Notes (Signed)
Pt states he started taking antibiotic today & now the left side of his face & throat feels like it is swelling.

## 2012-12-09 NOTE — ED Notes (Signed)
Pt presents with c/o allergic reaction to starting doxy-cycl-hycl 100 mg tablets 3 hrs ago secondary to a bulls-eye rash on left upper back from tick bite. Pt c/o left side facial swelling and left side throat tightness. Denies SOB. NAD noted at this time.

## 2012-12-09 NOTE — ED Provider Notes (Addendum)
History  This chart was scribed for Donnetta Hutching, MD by Ardeen Jourdain, ED Scribe. This patient was seen in room [facility]14/[facility]14 and the patient's care was started at 2310.  CSN: [id number] Arrival date & time 12/09/12  2205   First MD Initiated Contact with Patient 12/09/12 2310     Chief Complaint  Patient presents with  . Allergic Reaction    The history is provided by the patient. No language interpreter was used.    HPI Comments: Jeff Buck is a 37 y.o. male who presents to the Emergency Department complaining of an allergic reaction after taking doxycycline 4 hours ago. He states the left side of his face and his throat feel swollen with associated wheezing. He denies any trouble swallowing. He states he was bitten by a tick 11 days ago. He states he pulled the tick out of his back. Pt states he was prescribed the doxycycline Friday and filled it today. He denies taking doxycyline in the past. He denies any previous similar symptoms.   Past Medical History  Diagnosis Date  . Hypertension   . Sinusitis   . Anxiety   . Pancreatitis    Past Surgical History  Procedure Laterality Date  . Orthopedic surgery      left knee surgery   Family History  Problem Relation Age of Onset  . Heart failure Other    History  Substance Use Topics  . Smoking status: Former Smoker    Quit date: 05/22/2010  . Smokeless tobacco: Current User    Types: Snuff  . Alcohol Use: No    Review of Systems  A complete 10 system review of systems was obtained and all systems are negative except as noted in the HPI and PMH.    Allergies  Doxycycline  Home Medications    Triage Vitals: BP 119/77  Pulse 65  Temp(Src) 98.2 F (36.8 C) (Oral)  Resp 20  Ht 5\' 11"  (1.803 m)  Wt 225 lb (102.059 kg)  BMI 31.39 kg/m2  SpO2 98%  Physical Exam  Nursing note and vitals reviewed. Constitutional: He is oriented to person, place, and time. He appears well-developed and well-nourished.  HENT:   Head: Normocephalic and atraumatic.  Eyes: Conjunctivae and EOM are normal. Pupils are equal, round, and reactive to light.  Neck: Normal range of motion. Neck supple.  Tenderness to left anterior neck   Cardiovascular: Normal rate, regular rhythm and normal heart sounds.   Pulmonary/Chest: Effort normal and breath sounds normal. No respiratory distress. He has no wheezes. He has no rales. He exhibits no tenderness.  Abdominal: Soft. Bowel sounds are normal.  Musculoskeletal: Normal range of motion.  Neurological: He is alert and oriented to person, place, and time.  Skin: Skin is warm and dry.  5 cm circular area of macular erythema to left upper back with central target lesion  Psychiatric: He has a normal mood and affect.    ED Course  Procedures (including critical care time)  DIAGNOSTIC STUDIES: Oxygen Saturation is 98% on room air, normal by my interpretation.    COORDINATION OF CARE:  11:19 PM-Discussed treatment plan which includes solumedrol, benadryl, Pepcid and observation with pt at bedside and pt agreed to plan.   Labs Reviewed - No data to display No results found. No diagnosis found.  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  Discontinue doxycycline. Start amoxicillin per recommendation of [facility].  Prednisone for 5 days.  No airway distress at discharge  I personally performed the services described in this documentation, which was scribed in my presence. The recorded information has been reviewed and is accurate.    Donnetta Hutching, MD 12/10/12 0100  Donnetta Hutching, MD 12/10/12 [phone]

## 2012-12-10 MED ORDER — AMOXICILLIN 500 MG PO CAPS: 500.0000 mg | ORAL_CAPSULE | Freq: Three times a day (TID) | ORAL | Status: DC

## 2012-12-10 MED ORDER — PREDNISONE 20 MG PO TABS: ORAL_TABLET | ORAL | Status: DC

## 2012-12-10 MED ORDER — AMOXICILLIN 250 MG PO CAPS
500.0000 mg | ORAL_CAPSULE | Freq: Once | ORAL | Status: AC
  Administered 2012-12-10: 500 mg via ORAL
  Filled 2012-12-10: qty 2

## 2012-12-10 NOTE — ED Notes (Signed)
Gave patient water as requested and approved by MD. 

## 2013-06-22 ENCOUNTER — Emergency Department (HOSPITAL_COMMUNITY)
Admission: EM | Admit: 2013-06-22 | Discharge: 2013-06-22 | Disposition: A | Discharge status: Home / Self Care | Payer: BC Managed Care – PPO | Attending: Emergency Medicine | Admitting: Emergency Medicine

## 2013-06-22 ENCOUNTER — Encounter (HOSPITAL_COMMUNITY): Payer: Self-pay | Admitting: Emergency Medicine

## 2013-06-22 ENCOUNTER — Emergency Department (HOSPITAL_COMMUNITY): Payer: BC Managed Care – PPO

## 2013-06-22 DIAGNOSIS — R200 Anesthesia of skin: Secondary | ICD-10-CM

## 2013-06-22 DIAGNOSIS — Z8719 Personal history of other diseases of the digestive system: Secondary | ICD-10-CM | POA: Insufficient documentation

## 2013-06-22 DIAGNOSIS — M79641 Pain in right hand: Secondary | ICD-10-CM

## 2013-06-22 DIAGNOSIS — I10 Essential (primary) hypertension: Secondary | ICD-10-CM | POA: Insufficient documentation

## 2013-06-22 DIAGNOSIS — Z8709 Personal history of other diseases of the respiratory system: Secondary | ICD-10-CM | POA: Insufficient documentation

## 2013-06-22 DIAGNOSIS — R209 Unspecified disturbances of skin sensation: Secondary | ICD-10-CM | POA: Insufficient documentation

## 2013-06-22 DIAGNOSIS — R0789 Other chest pain: Secondary | ICD-10-CM | POA: Insufficient documentation

## 2013-06-22 DIAGNOSIS — R110 Nausea: Secondary | ICD-10-CM | POA: Insufficient documentation

## 2013-06-22 DIAGNOSIS — Z8659 Personal history of other mental and behavioral disorders: Secondary | ICD-10-CM | POA: Insufficient documentation

## 2013-06-22 DIAGNOSIS — Z87891 Personal history of nicotine dependence: Secondary | ICD-10-CM | POA: Insufficient documentation

## 2013-06-22 DIAGNOSIS — M79609 Pain in unspecified limb: Secondary | ICD-10-CM | POA: Insufficient documentation

## 2013-06-22 LAB — POCT I-STAT, CHEM 8
BUN: 18 mg/dL (ref 6–23)
CALCIUM ION: 1.17 mmol/L (ref 1.12–1.23)
Chloride: 105 mEq/L (ref 96–112)
Creatinine, Ser: 1.1 mg/dL (ref 0.50–1.35)
GLUCOSE: 102 mg/dL — AB (ref 70–99)
HEMATOCRIT: 41 % (ref 39.0–52.0)
HEMOGLOBIN: 13.9 g/dL (ref 13.0–17.0)
Potassium: 3.5 mEq/L — ABNORMAL LOW (ref 3.7–5.3)
Sodium: 141 mEq/L (ref 137–147)
TCO2: 24 mmol/L (ref 0–100)

## 2013-06-22 MED ORDER — ASPIRIN EC 325 MG PO TBEC: 325.0000 mg | DELAYED_RELEASE_TABLET | Freq: Every day | ORAL | Status: DC

## 2013-06-22 MED ORDER — ASPIRIN 81 MG PO CHEW
324.0000 mg | CHEWABLE_TABLET | Freq: Once | ORAL | Status: AC
  Administered 2013-06-22: 324 mg via ORAL
  Filled 2013-06-22: qty 4

## 2013-06-22 NOTE — ED Notes (Signed)
Pt reports waking up with numbness going down right arm with severe pain in right hand, and nauseous.

## 2013-06-22 NOTE — ED Provider Notes (Signed)
CSN: [id number]     Arrival date & time 06/22/13  0152 History   First MD Initiated Contact with Patient 06/22/13 0201     Chief Complaint  Patient presents with  . Numbness  . Hand Pain    Patient is a 38 y.o. male presenting with hand pain. The history is provided by the patient.  Hand Pain This is a new problem. The current episode started 1 to 2 hours ago. The problem occurs constantly. The problem has not changed since onset.Associated symptoms include chest pain. Pertinent negatives include no abdominal pain, no headaches and no shortness of breath. Nothing aggravates the symptoms. Nothing relieves the symptoms.  Pt reports he woke up in his chair 1 hour ago with right hand pain and numbness in his right arm No discoloration to the arm is reported He reports he was not lying on the arm He reports prior to falling asleep he felt well.   He reports when he woke up, his right hand was hurting and "felt pressure" and he had numbness on the inner surface of his right arm.  There was no weakness in the arm.  He reports after this started he has had brief episodes of right sided CP that are not pleuritic and does not radiate.  He is now CP free.  No SOB is reported. No HA No visual changes  He also reports his right face "feels funny" but no difficulty speaking, no slurred speech.  No facial weakness.  No leg weakness is reported.  No difficulty walking is reported  He denies h/o CAD/PE/CVA  Past Medical History  Diagnosis Date  . Hypertension   . Sinusitis   . Anxiety   . Pancreatitis    Past Surgical History  Procedure Laterality Date  . Orthopedic surgery      left knee surgery   Family History  Problem Relation Age of Onset  . Heart failure Other    History  Substance Use Topics  . Smoking status: Former Smoker    Quit date: 05/22/2010  . Smokeless tobacco: Current User    Types: Snuff  . Alcohol Use: No    Review of Systems  Constitutional: Negative for fever.   HENT: Negative for hearing loss.   Eyes: Negative for visual disturbance.  Respiratory: Negative for shortness of breath.   Cardiovascular: Positive for chest pain.  Gastrointestinal: Positive for nausea. Negative for vomiting and abdominal pain.  Musculoskeletal: Negative for back pain, joint swelling and neck pain.  Neurological: Positive for numbness. Negative for speech difficulty, weakness and headaches.  All other systems reviewed and are negative.    Allergies  Doxycycline  Home Medications    BP 124/82  Pulse 66  Temp(Src) 98.2 F (36.8 C) (Oral)  Ht 5\' 11"  (1.803 m)  Wt 235 lb (106.595 kg)  BMI 32.79 kg/m2  SpO2 97% Physical Exam CONSTITUTIONAL: Well developed/well nourished HEAD: Normocephalic/atraumatic EYES: EOMI/PERRL, no nystagmus ENMT: Mucous membranes moist NECK: supple no meningeal signs, no bruits SPINE:entire spine nontender CV: S1/S2 noted, no murmurs/rubs/gallops noted LUNGS: Lungs are clear to auscultation bilaterally, no apparent distress ABDOMEN: soft, nontender, no rebound or guarding GU:no cva tenderness NEURO:Awake/alert, facies symmetric, no arm or leg drift is noted Cranial nerves 3/4/5/6/11/27/08/11/12 tested and intact Gait normal without ataxia Equal power with hand grip, wrist flex/extension, elbow flex/extension, and equal power with shoulder abduction/adduction.he reports numbness on the volar aspect of right forearm and right upper arm Equal biceps/brachioradial reflex in bilateral UE No sensory  deficit noted to lower extremities He reports the right side of his face feels numbness, mostly at corner of mouth/cheek No facial droop.  No ptosis EXTREMITIES: pulses normal/equal in bilateral UE, full ROM, no discoloration of the upper extremities, no edema noted to upper extremities and no signs of trauma SKIN: warm, color normal PSYCH: no abnormalities of mood noted   ED Course  Procedures (including critical care time) 2:39 AM Pt  reports hand pain but also numbness to right UE and face.  He has no focal weakness.  No signs of cervical radiculopathy to explain arm pain.  Will obtain CT head due to numbness.   For his CP, he is now CP free, low suspicion for ACS/ PE (PERC negative currently) and EKG unremarkable.  I doubt aortic dissection given history/exam For hand pain no signs of trauma and no signs of vascular compromise at this time 3:54 AM Pt sleeping on repeat assessment and Pt reports feeling improved.  He reports continued hand pain but numbness improving He is low risk for CVA (only risk factor appears to be HTN), but given his symptoms, I feel it is reasonable for him to start ASA daily and see his PCP in 2 days.  We discussed strict return precautions Labs Review Labs Reviewed - No data to display Imaging Review No results found.  EKG Interpretation    Date/Time:  Sunday June 22 2013 02:07:39 EST Ventricular Rate:  60 PR Interval:  198 QRS Duration: 94 QT Interval:  400 QTC Calculation: 400 R Axis:     Text Interpretation:  Normal sinus rhythm Normal ECG When compared with ECG of 11-May-2011 16:44, No significant change was found Confirmed by Bebe ShaggyWICKLINE  MD, Telly Jawad [phone]([id number]) on 06/22/2013 2:16:32 AM            MDM  No diagnosis found. Nursing notes including past medical history and social history reviewed and considered in documentation Labs/vital reviewed and considered     Joya Gaskinsonald W Lucita Montoya, MD 06/22/13 [phone]

## 2013-06-22 NOTE — ED Notes (Signed)
Pt reports "I had chest pain that went down my right arm. Everything has zeroed out now".

## 2013-06-22 NOTE — ED Notes (Signed)
Patient placed on monitor for chest pain like symptoms.

## 2014-03-03 ENCOUNTER — Encounter (HOSPITAL_COMMUNITY): Payer: Self-pay | Admitting: Emergency Medicine

## 2014-03-03 ENCOUNTER — Emergency Department (HOSPITAL_COMMUNITY)
Admission: EM | Admit: 2014-03-03 | Discharge: 2014-03-04 | Disposition: A | Discharge status: Home / Self Care | Payer: BC Managed Care – PPO | Attending: Emergency Medicine | Admitting: Emergency Medicine

## 2014-03-03 DIAGNOSIS — Z87891 Personal history of nicotine dependence: Secondary | ICD-10-CM | POA: Diagnosis not present

## 2014-03-03 DIAGNOSIS — R1032 Left lower quadrant pain: Secondary | ICD-10-CM | POA: Insufficient documentation

## 2014-03-03 DIAGNOSIS — Z8659 Personal history of other mental and behavioral disorders: Secondary | ICD-10-CM | POA: Insufficient documentation

## 2014-03-03 DIAGNOSIS — Z8719 Personal history of other diseases of the digestive system: Secondary | ICD-10-CM | POA: Insufficient documentation

## 2014-03-03 DIAGNOSIS — I10 Essential (primary) hypertension: Secondary | ICD-10-CM | POA: Diagnosis not present

## 2014-03-03 DIAGNOSIS — R109 Unspecified abdominal pain: Secondary | ICD-10-CM

## 2014-03-03 NOTE — ED Notes (Signed)
Pt reports onset of left abdominal pain around 2200 this evening. Pt reports nausea, denies D/V. Pt reports history of pancreatitis. Pt reports similar exacerbation last year when he received the flu shot, and pt reports having the flu shot this past Friday with the same symptom onset today.

## 2014-03-04 ENCOUNTER — Emergency Department (HOSPITAL_COMMUNITY): Payer: BC Managed Care – PPO

## 2014-03-04 LAB — CBC WITH DIFFERENTIAL/PLATELET
BASOS ABS: 0 10*3/uL (ref 0.0–0.1)
BASOS PCT: 0 % (ref 0–1)
Eosinophils Absolute: 0.3 10*3/uL (ref 0.0–0.7)
Eosinophils Relative: 4 % (ref 0–5)
HEMATOCRIT: 37.1 % — AB (ref 39.0–52.0)
Hemoglobin: 12.9 g/dL — ABNORMAL LOW (ref 13.0–17.0)
LYMPHS PCT: 25 % (ref 12–46)
Lymphs Abs: 1.9 10*3/uL (ref 0.7–4.0)
MCH: 28.6 pg (ref 26.0–34.0)
MCHC: 34.8 g/dL (ref 30.0–36.0)
MCV: 82.3 fL (ref 78.0–100.0)
MONO ABS: 0.9 10*3/uL (ref 0.1–1.0)
Monocytes Relative: 12 % (ref 3–12)
NEUTROS ABS: 4.3 10*3/uL (ref 1.7–7.7)
NEUTROS PCT: 59 % (ref 43–77)
PLATELETS: 315 10*3/uL (ref 150–400)
RBC: 4.51 MIL/uL (ref 4.22–5.81)
RDW: 12.8 % (ref 11.5–15.5)
WBC: 7.4 10*3/uL (ref 4.0–10.5)

## 2014-03-04 LAB — COMPREHENSIVE METABOLIC PANEL
ALBUMIN: 3.7 g/dL (ref 3.5–5.2)
ALT: 28 U/L (ref 0–53)
AST: 24 U/L (ref 0–37)
Alkaline Phosphatase: 88 U/L (ref 39–117)
Anion gap: 11 (ref 5–15)
BILIRUBIN TOTAL: 0.5 mg/dL (ref 0.3–1.2)
BUN: 15 mg/dL (ref 6–23)
CALCIUM: 8.7 mg/dL (ref 8.4–10.5)
CHLORIDE: 104 meq/L (ref 96–112)
CO2: 26 mEq/L (ref 19–32)
CREATININE: 1 mg/dL (ref 0.50–1.35)
GFR calc Af Amer: >90 mL/min (ref >90)
GFR calc non Af Amer: >90 mL/min (ref >90)
Glucose, Bld: 135 mg/dL — ABNORMAL HIGH (ref 70–99)
Potassium: 3.7 mEq/L (ref 3.7–5.3)
SODIUM: 141 meq/L (ref 137–147)
Total Protein: 6.9 g/dL (ref 6.0–8.3)

## 2014-03-04 LAB — LIPASE, BLOOD: LIPASE: 38 U/L (ref 11–59)

## 2014-03-04 LAB — URINALYSIS, ROUTINE W REFLEX MICROSCOPIC
Bilirubin Urine: NEGATIVE
GLUCOSE, UA: NEGATIVE mg/dL
Hgb urine dipstick: NEGATIVE
Ketones, ur: NEGATIVE mg/dL
LEUKOCYTES UA: NEGATIVE
NITRITE: NEGATIVE
PH: 7 (ref 5.0–8.0)
Protein, ur: NEGATIVE mg/dL
SPECIFIC GRAVITY, URINE: 1.015 (ref 1.005–1.030)
Urobilinogen, UA: 0.2 mg/dL (ref 0.0–1.0)

## 2014-03-04 MED ORDER — MORPHINE SULFATE 4 MG/ML IJ SOLN
4.0000 mg | Freq: Once | INTRAMUSCULAR | Status: AC
  Administered 2014-03-04: 4 mg via INTRAVENOUS
  Filled 2014-03-04: qty 1

## 2014-03-04 MED ORDER — ONDANSETRON HCL 4 MG/2ML IJ SOLN
4.0000 mg | Freq: Once | INTRAMUSCULAR | Status: AC
  Administered 2014-03-04: 4 mg via INTRAVENOUS
  Filled 2014-03-04: qty 2

## 2014-03-04 MED ORDER — KETOROLAC TROMETHAMINE 30 MG/ML IJ SOLN
30.0000 mg | Freq: Once | INTRAMUSCULAR | Status: AC
  Administered 2014-03-04: 30 mg via INTRAVENOUS
  Filled 2014-03-04: qty 1

## 2014-03-04 MED ORDER — HYDROCODONE-ACETAMINOPHEN 5-325 MG PO TABS: 1.0000 | ORAL_TABLET | Freq: Four times a day (QID) | ORAL | Status: DC | PRN

## 2014-03-04 NOTE — ED Notes (Signed)
Pt gone over to CT 

## 2014-03-04 NOTE — Discharge Instructions (Signed)
Hydrocodone as prescribed as needed for severe pain.  Return to the emergency department if you develop worsening pain, high fever, or other new and concerning symptoms.   Flank Pain Flank pain refers to pain that is located on the side of the body between the upper abdomen and the back. The pain may occur over a short period of time (acute) or may be long-term or reoccurring (chronic). It may be mild or severe. Flank pain can be caused by many things. CAUSES  Some of the more common causes of flank pain include:  Muscle strains.   Muscle spasms.   A disease of your spine (vertebral disk disease).   A lung infection (pneumonia).   Fluid around your lungs (pulmonary edema).   A kidney infection.   Kidney stones.   A very painful skin rash caused by the chickenpox virus (shingles).   Gallbladder disease.  HOME CARE INSTRUCTIONS  Home care will depend on the cause of your pain. In general,  Rest as directed by your caregiver.  Drink enough fluids to keep your urine clear or pale yellow.  Only take over-the-counter or prescription medicines as directed by your caregiver. Some medicines may help relieve the pain.  Tell your caregiver about any changes in your pain.  Follow up with your caregiver as directed. SEEK IMMEDIATE MEDICAL CARE IF:   Your pain is not controlled with medicine.   You have new or worsening symptoms.  Your pain increases.   You have abdominal pain.   You have shortness of breath.   You have persistent nausea or vomiting.   You have swelling in your abdomen.   You feel faint or pass out.   You have blood in your urine.  You have a fever or persistent symptoms for more than 2-3 days.  You have a fever and your symptoms suddenly get worse. MAKE SURE YOU:   Understand these instructions.  Will watch your condition.  Will get help right away if you are not doing well or get worse. Document Released: 06/29/2005 Document  Revised: 01/31/2012 Document Reviewed: 12/21/2011 [facility] Patient Information 2015 [place], [place]. This information is not intended to replace advice given to you by your health care provider. Make sure you discuss any questions you have with your health care provider.

## 2014-03-04 NOTE — ED Provider Notes (Signed)
CSN: [id number]     Arrival date & time 03/03/14  2317 History  This chart was scribed for Jeff Buck Lamiya Naas, MD by Milly JakobJohn Lee Graves, ED Scribe. The patient was seen in room APA16A/APA16A. Patient's care was started at 12:04 AM.     Chief Complaint  Patient presents with  . Abdominal Pain   Patient is a 38 y.o. male presenting with abdominal pain. The history is provided by the patient. No language interpreter was used.  Abdominal Pain Pain location:  R flank Pain quality: sharp and stabbing   Pain radiates to:  LLQ Pain severity:  Severe Onset quality:  Sudden Duration:  2 hours Timing:  Constant Progression:  Worsening Chronicity:  New Relieved by:  Nothing Worsened by:  Palpation and position changes Ineffective treatments:  None tried  HPI Comments: Jeff Buck is a 38 y.o. male who presents to the Emergency Department complaining of severe, constant, rapid onset pain in his left lower back that radiates into his left lower abdomen. He reports that the pain woke him from sleeping. He reports associated nausea due to the pain. Pt reports a history of pancreatitis.   Past Medical History  Diagnosis Date  . Hypertension   . Sinusitis   . Anxiety   . Pancreatitis    Past Surgical History  Procedure Laterality Date  . Orthopedic surgery      left knee surgery   Family History  Problem Relation Age of Onset  . Heart failure Other    History  Substance Use Topics  . Smoking status: Former Smoker    Quit date: 05/22/2010  . Smokeless tobacco: Current User    Types: Snuff  . Alcohol Use: No    Review of Systems  Gastrointestinal: Positive for abdominal pain.  A complete 10 system review of systems was obtained and all systems are negative except as noted in the HPI and PMH.    Allergies  Doxycycline  Home Medications  See List  Triage Vitals: BP 142/66  Pulse 67  Temp(Src) 98.3 F (36.8 C) (Oral)  Resp 20  Ht 5\' 11"  (1.803 m)  Wt 230 lb (104.327 kg)  BMI  32.09 kg/m2  SpO2 99% Physical Exam  Nursing note and vitals reviewed. Constitutional: He is oriented to person, place, and time. He appears well-developed and well-nourished.  HENT:  Head: Normocephalic and atraumatic.  Eyes: Conjunctivae and EOM are normal. Pupils are equal, round, and reactive to light.  Neck: Normal range of motion. Neck supple.  Cardiovascular: Normal rate, regular rhythm and normal heart sounds.   No murmur heard. Pulmonary/Chest: Effort normal and breath sounds normal. No respiratory distress. He has no wheezes.  Abdominal: Soft. Bowel sounds are normal. There is no rebound and no guarding.  Tenderness to palpation in the left upper and left lower quadrant.   Musculoskeletal: Normal range of motion.  Neurological: He is alert and oriented to person, place, and time.  Skin: Skin is warm and dry.  Psychiatric: He has a normal mood and affect. His behavior is normal.    ED Course  Procedures (including critical care time) DIAGNOSTIC STUDIES: Oxygen Saturation is 99% on room air, normal by my interpretation.    COORDINATION OF CARE: 12:08 AM-Discussed treatment plan which includes UA, blood work, CT-scan with pt at bedside and pt agreed to plan.   Labs Review Labs Reviewed  CBC WITH DIFFERENTIAL  COMPREHENSIVE METABOLIC PANEL  LIPASE, BLOOD  URINALYSIS, ROUTINE W REFLEX MICROSCOPIC    Imaging  Review No results found.   EKG Interpretation None      MDM   Final diagnoses:  None    Patient is a 38 year old male who presents with complaints of sudden onset left flank pain that started 2 hours prior to arrival. His symptoms clinically seem like a kidney stone, however urinalysis and CT scan do not support this. The CT scan offered no alternate diagnosis. He appears stable and I do not feel there is an emergent process. I believe him to be stable for discharge. We'll treat his pain and give this time. He understands to worsen if his symptoms  substantially worsen or change.  I personally performed the services described in this documentation, which was scribed in my presence. The recorded information has been reviewed and is accurate.      Jeff Buck Jef Futch, MD 03/04/14 [phone]

## 2014-03-04 NOTE — ED Notes (Signed)
Urine specimen sent to lab

## 2014-12-29 ENCOUNTER — Encounter (HOSPITAL_COMMUNITY): Payer: Self-pay | Admitting: Emergency Medicine

## 2014-12-29 ENCOUNTER — Emergency Department (HOSPITAL_COMMUNITY)
Admission: EM | Admit: 2014-12-29 | Discharge: 2014-12-29 | Disposition: A | Discharge status: Home / Self Care | Payer: Self-pay | Attending: Emergency Medicine | Admitting: Emergency Medicine

## 2014-12-29 DIAGNOSIS — K219 Gastro-esophageal reflux disease without esophagitis: Secondary | ICD-10-CM | POA: Insufficient documentation

## 2014-12-29 DIAGNOSIS — Z7982 Long term (current) use of aspirin: Secondary | ICD-10-CM | POA: Insufficient documentation

## 2014-12-29 DIAGNOSIS — I10 Essential (primary) hypertension: Secondary | ICD-10-CM | POA: Insufficient documentation

## 2014-12-29 DIAGNOSIS — Z79899 Other long term (current) drug therapy: Secondary | ICD-10-CM | POA: Insufficient documentation

## 2014-12-29 DIAGNOSIS — F419 Anxiety disorder, unspecified: Secondary | ICD-10-CM | POA: Insufficient documentation

## 2014-12-29 DIAGNOSIS — K2970 Gastritis, unspecified, without bleeding: Secondary | ICD-10-CM | POA: Insufficient documentation

## 2014-12-29 DIAGNOSIS — R1012 Left upper quadrant pain: Secondary | ICD-10-CM

## 2014-12-29 DIAGNOSIS — Z87891 Personal history of nicotine dependence: Secondary | ICD-10-CM | POA: Insufficient documentation

## 2014-12-29 DIAGNOSIS — Z8709 Personal history of other diseases of the respiratory system: Secondary | ICD-10-CM | POA: Insufficient documentation

## 2014-12-29 LAB — COMPREHENSIVE METABOLIC PANEL
ALT: 29 U/L (ref 17–63)
AST: 23 U/L (ref 15–41)
Albumin: 4.2 g/dL (ref 3.5–5.0)
Alkaline Phosphatase: 100 U/L (ref 38–126)
Anion gap: 7 (ref 5–15)
BILIRUBIN TOTAL: 0.5 mg/dL (ref 0.3–1.2)
BUN: 16 mg/dL (ref 6–20)
CO2: 29 mmol/L (ref 22–32)
CREATININE: 0.94 mg/dL (ref 0.61–1.24)
Calcium: 9.3 mg/dL (ref 8.9–10.3)
Chloride: 103 mmol/L (ref 101–111)
GFR calc Af Amer: >60 mL/min (ref >60)
Glucose, Bld: 100 mg/dL — ABNORMAL HIGH (ref 65–99)
POTASSIUM: 4.2 mmol/L (ref 3.5–5.1)
SODIUM: 139 mmol/L (ref 135–145)
Total Protein: 7.4 g/dL (ref 6.5–8.1)

## 2014-12-29 LAB — URINALYSIS, ROUTINE W REFLEX MICROSCOPIC
Bilirubin Urine: NEGATIVE
Glucose, UA: NEGATIVE mg/dL
Hgb urine dipstick: NEGATIVE
Ketones, ur: NEGATIVE mg/dL
Leukocytes, UA: NEGATIVE
NITRITE: NEGATIVE
PH: 6.5 (ref 5.0–8.0)
Protein, ur: NEGATIVE mg/dL
SPECIFIC GRAVITY, URINE: 1.01 (ref 1.005–1.030)
Urobilinogen, UA: 0.2 mg/dL (ref 0.0–1.0)

## 2014-12-29 LAB — CBC WITH DIFFERENTIAL/PLATELET
Basophils Absolute: 0 10*3/uL (ref 0.0–0.1)
Basophils Relative: 1 % (ref 0–1)
Eosinophils Absolute: 0.3 10*3/uL (ref 0.0–0.7)
Eosinophils Relative: 3 % (ref 0–5)
HCT: 41.3 % (ref 39.0–52.0)
Hemoglobin: 14.1 g/dL (ref 13.0–17.0)
Lymphocytes Relative: 28 % (ref 12–46)
Lymphs Abs: 2.3 10*3/uL (ref 0.7–4.0)
MCH: 29 pg (ref 26.0–34.0)
MCHC: 34.1 g/dL (ref 30.0–36.0)
MCV: 84.8 fL (ref 78.0–100.0)
Monocytes Absolute: 0.8 10*3/uL (ref 0.1–1.0)
Monocytes Relative: 10 % (ref 3–12)
Neutro Abs: 4.8 10*3/uL (ref 1.7–7.7)
Neutrophils Relative %: 58 % (ref 43–77)
Platelets: 293 10*3/uL (ref 150–400)
RBC: 4.87 MIL/uL (ref 4.22–5.81)
RDW: 12.6 % (ref 11.5–15.5)
WBC: 8.2 10*3/uL (ref 4.0–10.5)

## 2014-12-29 LAB — LIPASE, BLOOD: Lipase: 27 U/L (ref 22–51)

## 2014-12-29 LAB — TROPONIN I: Troponin I: <0.03 ng/mL (ref <0.031)

## 2014-12-29 MED ORDER — SODIUM CHLORIDE 0.9 % IV SOLN
1000.0000 mL | INTRAVENOUS | Status: DC
  Administered 2014-12-29: 1000 mL via INTRAVENOUS

## 2014-12-29 MED ORDER — SODIUM CHLORIDE 0.9 % IV SOLN
1000.0000 mL | Freq: Once | INTRAVENOUS | Status: AC
  Administered 2014-12-29: 1000 mL via INTRAVENOUS

## 2014-12-29 MED ORDER — TRAMADOL HCL 50 MG PO TABS: 100.0000 mg | ORAL_TABLET | Freq: Four times a day (QID) | ORAL | Status: DC | PRN

## 2014-12-29 MED ORDER — OMEPRAZOLE 20 MG PO CPDR: DELAYED_RELEASE_CAPSULE | ORAL | Status: DC

## 2014-12-29 MED ORDER — ONDANSETRON HCL 4 MG/2ML IJ SOLN
4.0000 mg | Freq: Once | INTRAMUSCULAR | Status: AC
  Administered 2014-12-29: 4 mg via INTRAVENOUS
  Filled 2014-12-29: qty 2

## 2014-12-29 MED ORDER — FAMOTIDINE IN NACL 20-0.9 MG/50ML-% IV SOLN
20.0000 mg | Freq: Once | INTRAVENOUS | Status: AC
  Administered 2014-12-29: 20 mg via INTRAVENOUS
  Filled 2014-12-29: qty 50

## 2014-12-29 MED ORDER — PROMETHAZINE HCL 25 MG PO TABS: 25.0000 mg | ORAL_TABLET | Freq: Four times a day (QID) | ORAL | Status: DC | PRN

## 2014-12-29 MED ORDER — FENTANYL CITRATE (PF) 100 MCG/2ML IJ SOLN
50.0000 ug | Freq: Once | INTRAMUSCULAR | Status: AC
  Administered 2014-12-29: 50 ug via INTRAVENOUS
  Filled 2014-12-29: qty 2

## 2014-12-29 NOTE — ED Notes (Signed)
Checked with patient to see if he is able to give a urine specimen. Patient states that maybe if he had a drink of water it might help. Nurse made aware, patient not able to have anything by mouth at this time.

## 2014-12-29 NOTE — Discharge Instructions (Signed)
Drink plenty of fluids to prevent dehydration. Take the medications as prescribed. Look at the diet sheet and avoid the foods that they list. Take the Prilosec as prescribed, use the Phenergan as needed for nausea, take the tramadol for pain. Follow-up with your primary care doctor. If you continue to have pain he may need to see gastroenterology to have a endoscopy done to check you for stomach ulcers. Stomach ulcers do not show up on CT scan so one was not done tonight.   Food Choices for Gastroesophageal Reflux Disease When you have gastroesophageal reflux disease (GERD), the foods you eat and your eating habits are very important. Choosing the right foods can help ease the discomfort of GERD. WHAT GENERAL GUIDELINES DO I NEED TO FOLLOW?  Choose fruits, vegetables, whole grains, low-fat dairy products, and low-fat meat, fish, and poultry.  Limit fats such as oils, salad dressings, butter, nuts, and avocado.  Keep a food diary to identify foods that cause symptoms.  Avoid foods that cause reflux. These may be different for different people.  Eat frequent small meals instead of three large meals each day.  Eat your meals slowly, in a relaxed setting.  Limit fried foods.  Cook foods using methods other than frying.  Avoid drinking alcohol.  Avoid drinking large amounts of liquids with your meals.  Avoid bending over or lying down until 2-3 hours after eating. WHAT FOODS ARE NOT RECOMMENDED? The following are some foods and drinks that may worsen your symptoms: Vegetables Tomatoes. Tomato juice. Tomato and spaghetti sauce. Chili peppers. Onion and garlic. Horseradish. Fruits Oranges, grapefruit, and lemon (fruit and juice). Meats High-fat meats, fish, and poultry. This includes hot dogs, ribs, ham, sausage, salami, and bacon. Dairy Whole milk and chocolate milk. Sour cream. Cream. Butter. Ice cream. Cream cheese.  Beverages Coffee and tea, with or without caffeine. Carbonated  beverages or energy drinks. Condiments Hot sauce. Barbecue sauce.  Sweets/Desserts Chocolate and cocoa. Donuts. Peppermint and spearmint. Fats and Oils High-fat foods, including [place] fries and potato chips. Other Vinegar. Strong spices, such as black pepper, white pepper, red pepper, cayenne, curry powder, cloves, ginger, and chili powder. The items listed above may not be a complete list of foods and beverages to avoid. Contact your dietitian for more information. Document Released: 05/08/2005 Document Revised: 05/13/2013 Document Reviewed: 03/12/2013 [facility] Patient Information 2015 [place], [place]. This information is not intended to replace advice given to you by your health care provider. Make sure you discuss any questions you have with your health care provider.  Gastritis, Adult Gastritis is soreness and puffiness (inflammation) of the lining of the stomach. If you do not get help, gastritis can cause bleeding and sores (ulcers) in the stomach. HOME CARE   Only take medicine as told by your doctor.  If you were given antibiotic medicines, take them as told. Finish the medicines even if you start to feel better.  Drink enough fluids to keep your pee (urine) clear or pale yellow.  Avoid foods and drinks that make your problems worse. Foods you may want to avoid include:  Caffeine or alcohol.  Chocolate.  Mint.  Garlic and onions.  Spicy foods.  Citrus fruits, including oranges, lemons, or limes.  Food containing tomatoes, including sauce, chili, salsa, and pizza.  Fried and fatty foods.  Eat small meals throughout the day instead of large meals. GET HELP RIGHT AWAY IF:   You have black or dark red poop (stools).  You throw up (vomit) blood. It  may look like coffee grounds.  You cannot keep fluids down.  Your belly (abdominal) pain gets worse.  You have a fever.  You do not feel better after 1 week.  You have any other questions or concerns. MAKE SURE  YOU:   Understand these instructions.  Will watch your condition.  Will get help right away if you are not doing well or get worse. Document Released: 10/25/2007 Document Revised: 07/31/2011 Document Reviewed: 06/21/2011 [facility] Patient Information 2015 [place], [place]. This information is not intended to replace advice given to you by your health care provider. Make sure you discuss any questions you have with your health care provider.

## 2014-12-29 NOTE — ED Notes (Signed)
MD at bedside. 

## 2014-12-29 NOTE — ED Notes (Signed)
Patient complains of left flank pain x3 days. Patient has a hx of pancreatitis.

## 2014-12-29 NOTE — ED Notes (Signed)
Pt c/o left flank pain x 3 days 

## 2014-12-29 NOTE — ED Provider Notes (Signed)
CSN: [id number]     Arrival date & time 12/29/14  0408 History   First MD Initiated Contact with Patient 12/29/14 [id number]    Chief Complaint  Patient presents with  . Flank Pain     (Consider location/radiation/quality/duration/timing/severity/associated sxs/prior Treatment) HPI a he reports 3 days ago after eating white bread for 3 days in a row he started having left upper quadrant pain that radiates into his left flank and into the center of his back. He describes the pain as "annoying". He states he feels very bloated. He states eating any type of food now makes the pain a lot worse. He states lying still makes it feel little bit better. He also complains of a burning with burning fluid in his throat. He denies nausea, vomiting, fever, or diarrhea. He states he's had this twice before with pancreatitis. He was admitted to the hospital both those times. He states since then he has noted that when he eats white bread or pasta he has these flareups. He denies any alcohol use since he had his first pancreatitis episode. He states tonight he was at work and the pain became intolerable.  PCP Edison Pace at [facility]  Past Medical History  Diagnosis Date  . Hypertension   . Sinusitis   . Anxiety   . Pancreatitis    Past Surgical History  Procedure Laterality Date  . Orthopedic surgery      left knee surgery   Family History  Problem Relation Age of Onset  . Heart failure Other    History  Substance Use Topics  . Smoking status: Former Smoker    Quit date: 05/22/2010  . Smokeless tobacco: Current User    Types: Snuff  . Alcohol Use: No  employed  Review of Systems  All other systems reviewed and are negative.   Allergies  Doxycycline  Home Medications   Prior to Admission medications   Medication Sig Start Date End Date Taking? Authorizing Provider  Cyanocobalamin (VITAMIN B-12 CR PO) Take 1 tablet by mouth every morning.   Yes Historical Provider, MD  Ginkgo Biloba  (GNP GINGKO BILOBA EXTRACT PO) Take 1 tablet by mouth every morning.   Yes Historical Provider, MD  lisinopril (PRINIVIL,ZESTRIL) 20 MG tablet Take 20 mg by mouth every morning.    Yes Historical Provider, MD  psyllium (METAMUCIL) 58.6 % packet Take 1 packet by mouth daily.   Yes Historical Provider, MD  Pyridoxine HCl (VITAMIN B-6 PO) Take 1 tablet by mouth every morning.   Yes Historical Provider, MD  UNABLE TO FIND Apply 1 application topically daily as needed. For muscle aches and pain.Med Name: Horse Liniment (anti-Inflammatory)   Yes Historical Provider, MD  aspirin EC 325 MG tablet Take 1 tablet (325 mg total) by mouth daily. 06/22/13   Zadie Rhine, MD  HYDROcodone-acetaminophen (NORCO) 5-325 MG per tablet Take 1-2 tablets by mouth every 6 (six) hours as needed. 03/04/14   Geoffery Lyons, MD  omeprazole (PRILOSEC) 20 MG capsule Take 1 po BID x 2 weeks then once a day 12/29/14   Devoria Albe, MD  promethazine (PHENERGAN) 25 MG tablet Take 1 tablet (25 mg total) by mouth every 6 (six) hours as needed for nausea or vomiting. 12/29/14   Devoria Albe, MD  traMADol (ULTRAM) 50 MG tablet Take 2 tablets (100 mg total) by mouth every 6 (six) hours as needed. 12/29/14   Devoria Albe, MD   BP 128/95 mmHg  Pulse 73  Temp(Src) 97.9 F (36.6  C)  Resp 18  Ht 5\' 11"  (1.803 m)  Wt 235 lb (106.595 kg)  BMI 32.79 kg/m2  SpO2 98%  Vital signs normal   Physical Exam  Constitutional: He is oriented to person, place, and time. He appears well-developed and well-nourished.  Non-toxic appearance. He does not appear ill. No distress.  HENT:  Head: Normocephalic and atraumatic.  Right Ear: External ear normal.  Left Ear: External ear normal.  Nose: Nose normal. No mucosal edema or rhinorrhea.  Mouth/Throat: Oropharynx is clear and moist and mucous membranes are normal. No dental abscesses or uvula swelling.  Eyes: Conjunctivae and EOM are normal. Pupils are equal, round, and reactive to light.  Neck: Normal range of  motion and full passive range of motion without pain. Neck supple.  Cardiovascular: Normal rate, regular rhythm and normal heart sounds.  Exam reveals no gallop and no friction rub.   No murmur heard. Pulmonary/Chest: Effort normal and breath sounds normal. No respiratory distress. He has no wheezes. He has no rhonchi. He has no rales. He exhibits no tenderness and no crepitus.  Abdominal: Soft. Normal appearance and bowel sounds are normal. He exhibits no distension. There is tenderness. There is no rebound and no guarding.    Patient has tenderness in his left upper quadrant is shown  Musculoskeletal: Normal range of motion. He exhibits no edema or tenderness.  Moves all extremities well.   Neurological: He is alert and oriented to person, place, and time. He has normal strength. No cranial nerve deficit.  Skin: Skin is warm, dry and intact. No rash noted. No erythema. No pallor.  Psychiatric: He has a normal mood and affect. His speech is normal and behavior is normal. His mood appears not anxious.  Nursing note and vitals reviewed.   ED Course  Procedures (including critical care time) Medications  0.9 %  sodium chloride infusion (0 mLs Intravenous Stopped 12/29/14 0617)    Followed by  0.9 %  sodium chloride infusion (0 mLs Intravenous Stopped 12/29/14 0657)    Followed by  0.9 %  sodium chloride infusion (1,000 mLs Intravenous New Bag/Given 12/29/14 0659)  fentaNYL (SUBLIMAZE) injection 50 mcg (50 mcg Intravenous Given 12/29/14 0515)  ondansetron (ZOFRAN) injection 4 mg (4 mg Intravenous Given 12/29/14 0516)  famotidine (PEPCID) IVPB 20 mg premix (0 mg Intravenous Stopped 12/29/14 0555)   Patient was started on IV fluids for apparent dehydration by exam and IV nausea and pain medicine. He also was given IV Pepcid for his complaints of reflux heartburn  7:45 AM patient was given his test results. They were all normal. At this point I do not see a reason to repeat his CT scan. He did have a CT  scan in October which did not show anything new at that point he had similar symptoms as to today. We discussed treatment for gastritis in esophagitis or GERD and he is to follow up with his primary care doctor. If he's not improving he may need to have endoscopy done.`   March 04, 2014     CLINICAL DATA: Left flank pain radiating to the back. Initial encounter. On 17/2 1,014.  EXAM: CT ABDOMEN AND PELVIS WITHOUT CONTRAST  TECHNIQUE: Multidetector CT imaging of the abdomen and pelvis was performed following the standard protocol without IV contrast.  COMPARISON: None.  FINDINGS: BODY WALL: Unremarkable.  LOWER CHEST: Mild circumferential thickening of the distal esophagus favors a small hiatal hernia. Appearance is similar to 2014    IMPRESSION: No  acute intra-abdominal findings. No urolithiasis.   Electronically Signed  By: Tiburcio Pea M.D.  On: 03/04/2014 02:36      Labs Review Results for orders placed or performed during the hospital encounter of 12/29/14  Urinalysis, Routine w reflex microscopic (not at [facility])  Result Value Ref Range   Color, Urine YELLOW YELLOW   APPearance CLEAR CLEAR   Specific Gravity, Urine 1.010 1.005 - 1.030   pH 6.5 5.0 - 8.0   Glucose, UA NEGATIVE NEGATIVE mg/dL   Hgb urine dipstick NEGATIVE NEGATIVE   Bilirubin Urine NEGATIVE NEGATIVE   Ketones, ur NEGATIVE NEGATIVE mg/dL   Protein, ur NEGATIVE NEGATIVE mg/dL   Urobilinogen, UA 0.2 0.0 - 1.0 mg/dL   Nitrite NEGATIVE NEGATIVE   Leukocytes, UA NEGATIVE NEGATIVE  Comprehensive metabolic panel  Result Value Ref Range   Sodium 139 135 - 145 mmol/L   Potassium 4.2 3.5 - 5.1 mmol/L   Chloride 103 101 - 111 mmol/L   CO2 29 22 - 32 mmol/L   Glucose, Bld 100 (H) 65 - 99 mg/dL   BUN 16 6 - 20 mg/dL   Creatinine, Ser 8.11 0.61 - 1.24 mg/dL   Calcium 9.3 8.9 - 91.4 mg/dL   Total Protein 7.4 6.5 - 8.1 g/dL   Albumin 4.2 3.5 - 5.0 g/dL   AST 23 15 - 41 U/L   ALT  29 17 - 63 U/L   Alkaline Phosphatase 100 38 - 126 U/L   Total Bilirubin 0.5 0.3 - 1.2 mg/dL   GFR calc non Af Amer >60 >60 mL/min   GFR calc Af Amer >60 >60 mL/min   Anion gap 7 5 - 15  Lipase, blood  Result Value Ref Range   Lipase 27 22 - 51 U/L  CBC with Differential  Result Value Ref Range   WBC 8.2 4.0 - 10.5 K/uL   RBC 4.87 4.22 - 5.81 MIL/uL   Hemoglobin 14.1 13.0 - 17.0 g/dL   HCT 78.2 95.6 - 21.3 %   MCV 84.8 78.0 - 100.0 fL   MCH 29.0 26.0 - 34.0 pg   MCHC 34.1 30.0 - 36.0 g/dL   RDW 08.6 57.8 - 46.9 %   Platelets 293 150 - 400 K/uL   Neutrophils Relative % 58 43 - 77 %   Neutro Abs 4.8 1.7 - 7.7 K/uL   Lymphocytes Relative 28 12 - 46 %   Lymphs Abs 2.3 0.7 - 4.0 K/uL   Monocytes Relative 10 3 - 12 %   Monocytes Absolute 0.8 0.1 - 1.0 K/uL   Eosinophils Relative 3 0 - 5 %   Eosinophils Absolute 0.3 0.0 - 0.7 K/uL   Basophils Relative 1 0 - 1 %   Basophils Absolute 0.0 0.0 - 0.1 K/uL  Troponin I  Result Value Ref Range   Troponin I <0.03 <0.031 ng/mL   Laboratory interpretation all normal     Imaging Review No results found.   EKG Interpretation   Date/Time:  Tuesday December 29 2014 05:24:31 EDT Ventricular Rate:  72 PR Interval:  189 QRS Duration: 100 QT Interval:  391 QTC Calculation: 428 R Axis:   38 Text Interpretation:  Sinus rhythm Abnormal R-wave progression, early  transition No significant change since last tracing 22 Jun 2013 Confirmed  by Sheyann Sulton  MD-I, Devaris Quirk ([id number]) on 12/29/2014 5:29:55 AM      MDM   Final diagnoses:  LUQ pain  Gastritis  Gastroesophageal reflux disease without esophagitis   New Prescriptions  OMEPRAZOLE (PRILOSEC) 20 MG CAPSULE    Take 1 po BID x 2 weeks then once a day   PROMETHAZINE (PHENERGAN) 25 MG TABLET    Take 1 tablet (25 mg total) by mouth every 6 (six) hours as needed for nausea or vomiting.   TRAMADOL (ULTRAM) 50 MG TABLET    Take 2 tablets (100 mg total) by mouth every 6 (six) hours as needed.    Plan  discharge  Devoria Albe, MD, Concha Pyo, MD 12/29/14 [phone]

## 2014-12-29 NOTE — ED Notes (Signed)
Patient states that he is not having per say pain, he states that he is really sore and it hurts with movement x3 days

## 2015-02-01 ENCOUNTER — Encounter: Payer: Self-pay | Admitting: Internal Medicine

## 2015-02-17 ENCOUNTER — Encounter: Payer: Self-pay | Admitting: Gastroenterology

## 2015-02-17 ENCOUNTER — Telehealth: Payer: Self-pay | Admitting: Gastroenterology

## 2015-02-17 ENCOUNTER — Ambulatory Visit: Payer: Self-pay | Admitting: Gastroenterology

## 2015-02-17 NOTE — Telephone Encounter (Signed)
PATIENT WAS A NO SHOW AND LETTER SENT  °

## 2015-03-06 ENCOUNTER — Emergency Department (HOSPITAL_COMMUNITY): Payer: Self-pay

## 2015-03-06 ENCOUNTER — Encounter (HOSPITAL_COMMUNITY): Payer: Self-pay | Admitting: *Deleted

## 2015-03-06 ENCOUNTER — Emergency Department (HOSPITAL_COMMUNITY)
Admission: EM | Admit: 2015-03-06 | Discharge: 2015-03-07 | Disposition: A | Discharge status: Home / Self Care | Payer: Self-pay | Attending: Emergency Medicine | Admitting: Emergency Medicine

## 2015-03-06 DIAGNOSIS — Y998 Other external cause status: Secondary | ICD-10-CM | POA: Insufficient documentation

## 2015-03-06 DIAGNOSIS — X58XXXA Exposure to other specified factors, initial encounter: Secondary | ICD-10-CM | POA: Insufficient documentation

## 2015-03-06 DIAGNOSIS — Z87891 Personal history of nicotine dependence: Secondary | ICD-10-CM | POA: Insufficient documentation

## 2015-03-06 DIAGNOSIS — Y9389 Activity, other specified: Secondary | ICD-10-CM | POA: Insufficient documentation

## 2015-03-06 DIAGNOSIS — Y9289 Other specified places as the place of occurrence of the external cause: Secondary | ICD-10-CM | POA: Insufficient documentation

## 2015-03-06 DIAGNOSIS — Z8719 Personal history of other diseases of the digestive system: Secondary | ICD-10-CM | POA: Insufficient documentation

## 2015-03-06 DIAGNOSIS — T18128A Food in esophagus causing other injury, initial encounter: Secondary | ICD-10-CM | POA: Insufficient documentation

## 2015-03-06 DIAGNOSIS — Z8709 Personal history of other diseases of the respiratory system: Secondary | ICD-10-CM | POA: Insufficient documentation

## 2015-03-06 DIAGNOSIS — I10 Essential (primary) hypertension: Secondary | ICD-10-CM | POA: Insufficient documentation

## 2015-03-06 DIAGNOSIS — Z8659 Personal history of other mental and behavioral disorders: Secondary | ICD-10-CM | POA: Insufficient documentation

## 2015-03-06 MED ORDER — GLUCAGON HCL RDNA (DIAGNOSTIC) 1 MG IJ SOLR
1.0000 mg | Freq: Once | INTRAMUSCULAR | Status: AC
  Administered 2015-03-06: 1 mg via INTRAVENOUS
  Filled 2015-03-06: qty 1

## 2015-03-06 MED ORDER — ONDANSETRON HCL 4 MG/2ML IJ SOLN
4.0000 mg | Freq: Once | INTRAMUSCULAR | Status: AC
  Administered 2015-03-06: 4 mg via INTRAVENOUS
  Filled 2015-03-06: qty 2

## 2015-03-06 MED ORDER — MORPHINE SULFATE (PF) 4 MG/ML IV SOLN
4.0000 mg | Freq: Once | INTRAVENOUS | Status: AC
  Administered 2015-03-06: 4 mg via INTRAVENOUS
  Filled 2015-03-06: qty 1

## 2015-03-06 NOTE — ED Notes (Signed)
Pt given a sprite 

## 2015-03-06 NOTE — ED Provider Notes (Signed)
CSN: [id number]     Arrival date & time 03/06/15  2253 History   First MD Initiated Contact with Patient 03/06/15 2307     Chief Complaint  Patient presents with  . food stuck in throat       HPI  Patient reports he ate steak about one hour ago and immediately felt pain and reports it "feels like it is stuck."  He reports since then he has had pain in his chest and epigastric region as well as nausea and difficulty swallowing.  He reports it is worsening.  Nothing improves his symptoms.  Swallowing worsens his symptoms.    Past Medical History  Diagnosis Date  . Hypertension   . Sinusitis   . Anxiety   . Pancreatitis    Past Surgical History  Procedure Laterality Date  . Orthopedic surgery      left knee surgery   Family History  Problem Relation Age of Onset  . Heart failure Other    Social History  Substance Use Topics  . Smoking status: Former Smoker    Quit date: 05/22/2010  . Smokeless tobacco: Current User    Types: Snuff  . Alcohol Use: No    Review of Systems  Constitutional: Negative for fever.  Gastrointestinal: Positive for nausea.  All other systems reviewed and are negative.     Allergies  Doxycycline  Home Medications    BP 129/88 mmHg  Pulse 73  Temp(Src) 98.1 F (36.7 C) (Oral)  Resp 16  Ht 5\' 11"  (1.803 m)  Wt 235 lb (106.595 kg)  BMI 32.79 kg/m2  SpO2 99% Physical Exam CONSTITUTIONAL: Well developed/well nourished HEAD: Normocephalic/atraumatic EYES: EOMI/PERRL ENMT: Mucous membranes moist, no drooling, no stridor, uvula midline, normal phonation NECK: supple no meningeal signs CV: S1/S2 noted, no murmurs/rubs/gallops noted LUNGS: Lungs are clear to auscultation bilaterally, no apparent distress ABDOMEN: soft, nontender, no rebound or guarding, bowel sounds noted throughout abdomen NEURO: Pt is awake/alert/appropriate, moves all extremitiesx4.  No facial droop.   EXTREMITIES:  full ROM SKIN: warm, color normal PSYCH: no  abnormalities of mood noted, alert and oriented to situation  ED Course  Procedures  Medications  morphine 4 MG/ML injection 4 mg (4 mg Intravenous Given 03/06/15 2326)  ondansetron (ZOFRAN) injection 4 mg (4 mg Intravenous Given 03/06/15 2325)  glucagon (human recombinant) (GLUCAGEN) injection 1 mg (1 mg Intravenous Given 03/06/15 2326)    Imaging Review Dg Chest Portable 1 View  03/06/2015  CLINICAL DATA:  Initial valuation for acute generalized chest pain for 1 day. Feels like food is stuck in throat. EXAM: PORTABLE CHEST 1 VIEW COMPARISON:  Prior radiograph from 03/28/2011. FINDINGS: The cardiac and mediastinal silhouettes are stable in size and contour, and remain within normal limits. Tracheal air column is midline and patent. The lungs are normally inflated. No airspace consolidation, pleural effusion, or pulmonary edema is identified. There is no pneumothorax. No acute osseous abnormality identified. IMPRESSION: No active disease. Electronically Signed   By: Rise MuBenjamin  McClintock M.D.   On: 03/06/2015 23:37   I have personally reviewed and evaluated these images  results as part of my medical decision-making.   EKG Interpretation   Date/Time:  Saturday March 06 2015 23:10:13 EDT Ventricular Rate:  84 PR Interval:  200 QRS Duration: 100 QT Interval:  380 QTC Calculation: 449 R Axis:   29 Text Interpretation:  Sinus rhythm Normal ECG No significant change since  last tracing Confirmed by Bebe ShaggyWICKLINE  MD, [place] ([id number]) on 03/06/2015  11:15:34 PM     12:02 AM Pt improving No active vomiting at this time 12:56 AM Pt sleeping He reports he feels improved No vomiting He would like to go home We discussed diet restrictions Given referral to GI Discussed strict return precautions   MDM   Final diagnoses:  Food impaction of esophagus, initial encounter    Nursing notes including past medical history and social history reviewed and considered in  documentation xrays/imaging reviewed by myself and considered during evaluation     Zadie Rhine, MD 03/07/15 [phone]

## 2015-03-06 NOTE — ED Notes (Signed)
Pt states he feel like he has food stuck in his throat.

## 2015-03-07 NOTE — Discharge Instructions (Signed)
PLEASE DO NOT EAT ANY STEAK/PORK CHOPS/HOT DOGS OVER NEXT WEEK.  PLEASE EAT SMALL/SOFT DIET OVER NEXT WEEK RETURN TO ER FOR ANY VOMITING, SEVERE CHEST OR ABDOMINAL PAIN OR IF YOU ARE UNABLE TO SWALLOW ANY LIQUIDS

## 2015-06-03 ENCOUNTER — Emergency Department (HOSPITAL_COMMUNITY): Payer: 59

## 2015-06-03 ENCOUNTER — Encounter (HOSPITAL_COMMUNITY): Payer: Self-pay | Admitting: *Deleted

## 2015-06-03 ENCOUNTER — Emergency Department (HOSPITAL_COMMUNITY)
Admission: EM | Admit: 2015-06-03 | Discharge: 2015-06-03 | Disposition: A | Discharge status: Home / Self Care | Payer: 59 | Attending: Emergency Medicine | Admitting: Emergency Medicine

## 2015-06-03 DIAGNOSIS — Z79899 Other long term (current) drug therapy: Secondary | ICD-10-CM | POA: Diagnosis not present

## 2015-06-03 DIAGNOSIS — Z8719 Personal history of other diseases of the digestive system: Secondary | ICD-10-CM | POA: Insufficient documentation

## 2015-06-03 DIAGNOSIS — F17210 Nicotine dependence, cigarettes, uncomplicated: Secondary | ICD-10-CM | POA: Insufficient documentation

## 2015-06-03 DIAGNOSIS — Z7982 Long term (current) use of aspirin: Secondary | ICD-10-CM | POA: Diagnosis not present

## 2015-06-03 DIAGNOSIS — R079 Chest pain, unspecified: Secondary | ICD-10-CM | POA: Diagnosis not present

## 2015-06-03 DIAGNOSIS — J069 Acute upper respiratory infection, unspecified: Secondary | ICD-10-CM | POA: Insufficient documentation

## 2015-06-03 DIAGNOSIS — I10 Essential (primary) hypertension: Secondary | ICD-10-CM | POA: Diagnosis not present

## 2015-06-03 LAB — BASIC METABOLIC PANEL
ANION GAP: 7 (ref 5–15)
BUN: 17 mg/dL (ref 6–20)
CHLORIDE: 107 mmol/L (ref 101–111)
CO2: 27 mmol/L (ref 22–32)
Calcium: 9.1 mg/dL (ref 8.9–10.3)
Creatinine, Ser: 1.03 mg/dL (ref 0.61–1.24)
GFR calc Af Amer: >60 mL/min (ref >60)
GLUCOSE: 106 mg/dL — AB (ref 65–99)
Potassium: 3.8 mmol/L (ref 3.5–5.1)
Sodium: 141 mmol/L (ref 135–145)

## 2015-06-03 LAB — CBC
HCT: 39.3 % (ref 39.0–52.0)
HEMOGLOBIN: 13.6 g/dL (ref 13.0–17.0)
MCH: 28.5 pg (ref 26.0–34.0)
MCHC: 34.6 g/dL (ref 30.0–36.0)
MCV: 82.2 fL (ref 78.0–100.0)
Platelets: 304 10*3/uL (ref 150–400)
RBC: 4.78 MIL/uL (ref 4.22–5.81)
RDW: 12.6 % (ref 11.5–15.5)
WBC: 8.6 10*3/uL (ref 4.0–10.5)

## 2015-06-03 LAB — TROPONIN I: Troponin I: <0.03 ng/mL (ref <0.031)

## 2015-06-03 NOTE — Discharge Instructions (Signed)

## 2015-06-03 NOTE — ED Provider Notes (Signed)
CSN: [id number]     Arrival date & time 06/03/15  1235 History   First MD Initiated Contact with Patient 06/03/15 1456     Chief Complaint  Patient presents with  . Chest Pain     HPI Patient presents to emergency department complaining of intermittent sharp left upper extremity pain and sharp anterior left chest pain.  He states pain last for 10 or 15 seconds and subsides.  It occurred intermittently in the mornings over the past several days.  He works as a Radiographer, therapeuticmaintenance specialist.  He never gets discomfort or pain with exertion or at work.  No prior history of cardiac disease.  He is 40 years old.  His only cardiac risk factors are tobacco abuse and hypertension.  He does not have a family history of early cardiac disease.  He denies active discomfort at this time.  He states his pain is shooting from his left anterior bicep region to his left antecubital fossa.  Denies weakness of his left arm or left hand.  He denies neck pain, injury, trauma   Past Medical History  Diagnosis Date  . Hypertension   . Sinusitis   . Anxiety   . Pancreatitis    Past Surgical History  Procedure Laterality Date  . Orthopedic surgery      left knee surgery   Family History  Problem Relation Age of Onset  . Heart failure Other    Social History  Substance Use Topics  . Smoking status: Current Some Day Smoker    Types: Cigarettes    Last Attempt to Quit: 05/22/2010  . Smokeless tobacco: Current User    Types: Snuff  . Alcohol Use: No    Review of Systems  All other systems reviewed and are negative.     Allergies  Doxycycline  Home Medications   Prior to Admission medications   Medication Sig Start Date End Date Taking? Authorizing Provider  aspirin EC 325 MG tablet Take 1 tablet (325 mg total) by mouth daily. 06/22/13   Zadie Rhineonald Wickline, MD  HYDROcodone-acetaminophen (NORCO) 5-325 MG per tablet Take 1-2 tablets by mouth every 6 (six) hours as needed. 03/04/14   [name] Delo, MD   lisinopril (PRINIVIL,ZESTRIL) 20 MG tablet Take 20 mg by mouth every morning.     Historical Provider, MD  omeprazole (PRILOSEC) 20 MG capsule Take 1 po BID x 2 weeks then once a day 12/29/14   Devoria AlbeIva Knapp, MD  promethazine (PHENERGAN) 25 MG tablet Take 1 tablet (25 mg total) by mouth every 6 (six) hours as needed for nausea or vomiting. 12/29/14   Devoria AlbeIva Knapp, MD  psyllium (METAMUCIL) 58.6 % packet Take 1 packet by mouth daily.    Historical Provider, MD  Pyridoxine HCl (VITAMIN B-6 PO) Take 1 tablet by mouth every morning.    Historical Provider, MD  traMADol (ULTRAM) 50 MG tablet Take 2 tablets (100 mg total) by mouth every 6 (six) hours as needed. 12/29/14   Devoria AlbeIva Knapp, MD  UNABLE TO FIND Apply 1 application topically daily as needed. For muscle aches and pain.Med Name: Horse Liniment (anti-Inflammatory)    Historical Provider, MD   BP 113/77 mmHg  Pulse 66  Temp(Src) 97.6 F (36.4 C) (Tympanic)  Resp 18  Ht 5\' 11"  (1.803 m)  Wt 242 lb (109.77 kg)  BMI 33.77 kg/m2  SpO2 99% Physical Exam  Constitutional: He is oriented to person, place, and time. He appears well-developed and well-nourished.  HENT:  Head: Normocephalic and atraumatic.  Eyes: EOM are normal.  Neck: Normal range of motion.  Cardiovascular: Normal rate, regular rhythm, normal heart sounds and intact distal pulses.   Pulmonary/Chest: Effort normal and breath sounds normal. No respiratory distress.  Abdominal: Soft. He exhibits no distension. There is no tenderness.  Musculoskeletal: Normal range of motion.  Full range of motion of left shoulder and left elbow.  Normal left radial pulse.  Normal grip strength left hand.  No tenderness of anterior left shoulder bicep region.  No overlying skin changes of the left bicep.  No swelling of the left upper extremity as compared to the right  Neurological: He is alert and oriented to person, place, and time.  Skin: Skin is warm and dry.  Psychiatric: He has a normal mood and affect.  Judgment normal.  Nursing note and vitals reviewed.   ED Course  Procedures (including critical care time) Labs Review Labs Reviewed  BASIC METABOLIC PANEL - Abnormal; Notable for the following:    Glucose, Bld 106 (*)    All other components within normal limits  CBC  TROPONIN I    Imaging Review Dg Chest 2 View  06/03/2015  CLINICAL DATA:  Chest pain for 1 day.  Shortness of breath. EXAM: CHEST  2 VIEW COMPARISON:  March 06, 2015 FINDINGS: Lungs are clear. Heart size and pulmonary vascularity are normal. No adenopathy. There is mild degenerative change in the thoracic spine. No pneumothorax. IMPRESSION: No edema or consolidation. Electronically Signed   By: Bretta Bang III M.D.   On: 06/03/2015 13:21   I have personally reviewed and evaluated these images and lab results as part of my medical decision-making.   EKG Interpretation   Date/Time:  Thursday June 03 2015 15:24:19 EST Ventricular Rate:  65 PR Interval:  198 QRS Duration: 107 QT Interval:  395 QTC Calculation: 411 R Axis:   27 Text Interpretation:  Sinus rhythm No significant change was found  Confirmed by Hagan Vanauken  MD, Lyann Hagstrom ([id number]) on 06/03/2015 3:32:50 PM      MDM   Final diagnoses:  Chest pain, unspecified chest pain type  Upper respiratory tract infection    Atypical pain.  Neurovascularly intact.  Doubt ACS.  EKG sinus rhythm.  Primary care follow-up.    Azalia Bilis, MD 06/03/15 1537

## 2015-06-03 NOTE — ED Notes (Signed)
Intermittent CP x 3 days. States pain began today at ~1100. Denies pain at present. Pt states pain to left arm which is intermittent as well. Pain lasts ~1 hour. Pain is described as sharp. Denies strenuous activity.

## 2015-08-19 ENCOUNTER — Emergency Department (HOSPITAL_COMMUNITY): Payer: 59

## 2015-08-19 ENCOUNTER — Emergency Department (HOSPITAL_COMMUNITY)
Admission: EM | Admit: 2015-08-19 | Discharge: 2015-08-19 | Disposition: A | Discharge status: Home / Self Care | Payer: 59 | Attending: Emergency Medicine | Admitting: Emergency Medicine

## 2015-08-19 ENCOUNTER — Encounter (HOSPITAL_COMMUNITY): Payer: Self-pay | Admitting: Emergency Medicine

## 2015-08-19 DIAGNOSIS — I10 Essential (primary) hypertension: Secondary | ICD-10-CM | POA: Diagnosis not present

## 2015-08-19 DIAGNOSIS — F17210 Nicotine dependence, cigarettes, uncomplicated: Secondary | ICD-10-CM | POA: Diagnosis not present

## 2015-08-19 DIAGNOSIS — Z79899 Other long term (current) drug therapy: Secondary | ICD-10-CM | POA: Insufficient documentation

## 2015-08-19 DIAGNOSIS — K8500 Idiopathic acute pancreatitis without necrosis or infection: Secondary | ICD-10-CM | POA: Diagnosis not present

## 2015-08-19 DIAGNOSIS — R1013 Epigastric pain: Secondary | ICD-10-CM | POA: Diagnosis present

## 2015-08-19 LAB — CBC
HEMATOCRIT: 39.5 % (ref 39.0–52.0)
Hemoglobin: 13.6 g/dL (ref 13.0–17.0)
MCH: 28.4 pg (ref 26.0–34.0)
MCHC: 34.4 g/dL (ref 30.0–36.0)
MCV: 82.5 fL (ref 78.0–100.0)
Platelets: 326 10*3/uL (ref 150–400)
RBC: 4.79 MIL/uL (ref 4.22–5.81)
RDW: 12.7 % (ref 11.5–15.5)
WBC: 11.5 10*3/uL — ABNORMAL HIGH (ref 4.0–10.5)

## 2015-08-19 LAB — COMPREHENSIVE METABOLIC PANEL
ALBUMIN: 4.3 g/dL (ref 3.5–5.0)
ALT: 34 U/L (ref 17–63)
AST: 29 U/L (ref 15–41)
Alkaline Phosphatase: 65 U/L (ref 38–126)
Anion gap: 8 (ref 5–15)
BUN: 14 mg/dL (ref 6–20)
CHLORIDE: 106 mmol/L (ref 101–111)
CO2: 27 mmol/L (ref 22–32)
CREATININE: 0.89 mg/dL (ref 0.61–1.24)
Calcium: 9.1 mg/dL (ref 8.9–10.3)
GFR calc Af Amer: >60 mL/min (ref >60)
GFR calc non Af Amer: >60 mL/min (ref >60)
GLUCOSE: 100 mg/dL — AB (ref 65–99)
Potassium: 3.8 mmol/L (ref 3.5–5.1)
Sodium: 141 mmol/L (ref 135–145)
Total Bilirubin: 1.1 mg/dL (ref 0.3–1.2)
Total Protein: 7.5 g/dL (ref 6.5–8.1)

## 2015-08-19 LAB — URINALYSIS, ROUTINE W REFLEX MICROSCOPIC
BILIRUBIN URINE: NEGATIVE
Glucose, UA: NEGATIVE mg/dL
HGB URINE DIPSTICK: NEGATIVE
Ketones, ur: NEGATIVE mg/dL
Leukocytes, UA: NEGATIVE
Nitrite: NEGATIVE
PH: 7 (ref 5.0–8.0)
Protein, ur: NEGATIVE mg/dL
Specific Gravity, Urine: 1.01 (ref 1.005–1.030)

## 2015-08-19 LAB — LIPASE, BLOOD: Lipase: 40 U/L (ref 11–51)

## 2015-08-19 MED ORDER — OXYCODONE-ACETAMINOPHEN 5-325 MG PO TABS: 2.0000 | ORAL_TABLET | ORAL | Status: DC | PRN

## 2015-08-19 MED ORDER — DIAZEPAM 5 MG/ML IJ SOLN
5.0000 mg | Freq: Once | INTRAMUSCULAR | Status: AC
  Administered 2015-08-19: 5 mg via INTRAVENOUS
  Filled 2015-08-19: qty 2

## 2015-08-19 MED ORDER — ONDANSETRON HCL 4 MG/2ML IJ SOLN
4.0000 mg | Freq: Once | INTRAMUSCULAR | Status: AC
  Administered 2015-08-19: 4 mg via INTRAVENOUS
  Filled 2015-08-19: qty 2

## 2015-08-19 MED ORDER — SODIUM CHLORIDE 0.9 % IV BOLUS (SEPSIS)
1000.0000 mL | Freq: Once | INTRAVENOUS | Status: AC
  Administered 2015-08-19: 1000 mL via INTRAVENOUS

## 2015-08-19 MED ORDER — ONDANSETRON 4 MG PREPACK (~~LOC~~): 1.0000 | ORAL_TABLET | Freq: Three times a day (TID) | ORAL | Status: DC | PRN

## 2015-08-19 MED ORDER — IOHEXOL 300 MG/ML  SOLN
25.0000 mL | Freq: Once | INTRAMUSCULAR | Status: AC | PRN
  Administered 2015-08-19: 25 mL via ORAL

## 2015-08-19 MED ORDER — IOHEXOL 300 MG/ML  SOLN
125.0000 mL | Freq: Once | INTRAMUSCULAR | Status: AC | PRN
  Administered 2015-08-19: 125 mL via INTRAVENOUS

## 2015-08-19 MED ORDER — GI COCKTAIL ~~LOC~~
30.0000 mL | Freq: Once | ORAL | Status: AC
  Administered 2015-08-19: 30 mL via ORAL
  Filled 2015-08-19: qty 30

## 2015-08-19 MED ORDER — OXYCODONE-ACETAMINOPHEN 5-325 MG PO TABS
2.0000 | ORAL_TABLET | Freq: Once | ORAL | Status: AC
  Administered 2015-08-19: 2 via ORAL
  Filled 2015-08-19: qty 2

## 2015-08-19 MED ORDER — RANITIDINE HCL 150 MG/10ML PO SYRP
150.0000 mg | ORAL_SOLUTION | Freq: Once | ORAL | Status: AC
  Administered 2015-08-19: 150 mg via ORAL
  Filled 2015-08-19: qty 10

## 2015-08-19 MED ORDER — PROMETHAZINE HCL 25 MG PO TABS: 25.0000 mg | ORAL_TABLET | Freq: Four times a day (QID) | ORAL | Status: DC | PRN

## 2015-08-19 MED ORDER — HYDROMORPHONE HCL 1 MG/ML IJ SOLN
1.0000 mg | Freq: Once | INTRAMUSCULAR | Status: AC
  Administered 2015-08-19: 1 mg via INTRAVENOUS
  Filled 2015-08-19: qty 1

## 2015-08-19 NOTE — ED Provider Notes (Signed)
CSN: [id number]     Arrival date & time 08/19/15  1358 History   First MD Initiated Contact with Patient 08/19/15 1635     Chief Complaint  Patient presents with  . Abdominal Pain     (Consider location/radiation/quality/duration/timing/severity/associated sxs/prior Treatment) Patient is a 40 y.o. male presenting with abdominal pain.  Abdominal Pain Pain location:  Epigastric Pain quality: sharp and shooting   Pain radiates to:  Back, L flank and R flank Pain severity:  Mild Onset quality:  Gradual Duration:  3 days Timing:  Intermittent Progression:  Worsening Chronicity:  Recurrent Relieved by:  None tried Worsened by:  Nothing tried Ineffective treatments:  None tried Associated symptoms: no chest pain, no chills, no constipation, no cough, no diarrhea, no dysuria, no fever, no shortness of breath and no vomiting     Past Medical History  Diagnosis Date  . Hypertension   . Sinusitis   . Anxiety   . Pancreatitis    Past Surgical History  Procedure Laterality Date  . Orthopedic surgery      left knee surgery   Family History  Problem Relation Age of Onset  . Heart failure Other    Social History  Substance Use Topics  . Smoking status: Current Some Day Smoker    Types: Cigarettes    Last Attempt to Quit: 05/22/2010  . Smokeless tobacco: Current User    Types: Snuff  . Alcohol Use: No    Review of Systems  Constitutional: Negative for fever, chills and activity change.  HENT: Negative for congestion and rhinorrhea.   Eyes: Negative for visual disturbance.  Respiratory: Negative for cough and shortness of breath.   Cardiovascular: Negative for chest pain.  Gastrointestinal: Positive for abdominal pain. Negative for vomiting, diarrhea and constipation.  Endocrine: Negative for polyuria.  Genitourinary: Negative for dysuria and flank pain.  Musculoskeletal: Negative for back pain and neck pain.  Skin: Negative for wound.  Neurological: Negative for  headaches.      Allergies  Doxycycline and Hctz  Home Medications   Prior to Admission medications   Medication Sig Start Date End Date Taking? Authorizing Provider  lisinopril (PRINIVIL,ZESTRIL) 20 MG tablet Take 20 mg by mouth daily.    Yes Historical Provider, MD  Pyridoxine HCl (VITAMIN B-6 PO) Take 1 tablet by mouth daily.    Yes Historical Provider, MD  vitamin E 400 UNIT capsule Take 400 Units by mouth daily.   Yes Historical Provider, MD  aspirin EC 325 MG tablet Take 1 tablet (325 mg total) by mouth daily. Patient not taking: Reported on 08/19/2015 06/22/13   Zadie Rhine, MD  ondansetron [facility]) 4 mg TABS tablet Take 4 tablets by mouth every 8 (eight) hours as needed. 08/19/15   Marily Memos, MD  oxyCODONE-acetaminophen (PERCOCET) 5-325 MG tablet Take 2 tablets by mouth every 4 (four) hours as needed. 08/19/15   Marily Memos, MD  oxyCODONE-acetaminophen (PERCOCET/ROXICET) 5-325 MG tablet Take 2 tablets by mouth every 4 (four) hours as needed for severe pain. 08/19/15   Marily Memos, MD  promethazine (PHENERGAN) 25 MG tablet Take 1 tablet (25 mg total) by mouth every 6 (six) hours as needed for nausea or vomiting. 08/19/15   Marily Memos, MD  UNABLE TO FIND Apply 1 application topically daily as needed. For muscle aches and pain.Med Name: *Horse Liniment (anti-Inflammatory)    Historical Provider, MD   BP 125/86 mmHg  Pulse 66  Temp(Src) 97.9 F (36.6 C) (Oral)  Resp 20  Ht   (1.803 m)  Wt 250 lb (113.399 kg)  BMI 34.88 kg/m2  SpO2 98% Physical Exam  Constitutional: He appears well-developed and well-nourished.  HENT:  Head: Normocephalic and atraumatic.  Neck: Normal range of motion.  Cardiovascular: Normal rate.   Pulmonary/Chest: Effort normal. No respiratory distress.  Abdominal: He exhibits no distension. There is no tenderness. There is no rebound and no guarding.  Musculoskeletal: Normal range of motion.  Neurological: He is alert.  Skin: Skin is warm and  dry.  Nursing note and vitals reviewed.   ED Course  Procedures (including critical care time) Labs Review Labs Reviewed  COMPREHENSIVE METABOLIC PANEL - Abnormal; Notable for the following:    Glucose, Bld 100 (*)    All other components within normal limits  CBC - Abnormal; Notable for the following:    WBC 11.5 (*)    All other components within normal limits  URINALYSIS, ROUTINE W REFLEX MICROSCOPIC (NOT AT [facility])  LIPASE, BLOOD    Imaging Review Ct Abdomen Pelvis W Contrast  08/19/2015  CLINICAL DATA:  40 year old male with abdominal pain and cramping. EXAM: CT ABDOMEN AND PELVIS WITH CONTRAST TECHNIQUE: Multidetector CT imaging of the abdomen and pelvis was performed using the standard protocol following bolus administration of intravenous contrast. CONTRAST:  25mL OMNIPAQUE IOHEXOL 300 MG/ML SOLN, OMNIPAQUE IOHEXOL 300 MG/ML SOLN COMPARISON:  CT dated 03/04/2014 FINDINGS: The visualized lung bases are clear. No intra-abdominal free air or free fluid. There is mild inflammatory changes of the pancreas predominantly involving the uncinate process and head of the pancreas compatible with acute pancreatitis. No drainable fluid collection/abscess or pseudocyst. The liver, gallbladder, spleen, adrenal glands, kidneys, visualized ureters, and urinary bladder appear unremarkable. The prostate and seminal vesicles are grossly unremarkable. Small hiatal hernia. There is no evidence of bowel obstruction or inflammation. Normal appendix. The abdominal aorta and IVC appear unremarkable. No portal venous gas identified. There is no adenopathy. The the splenic vein remains patent. Small fat containing umbilical hernia. The abdominal wall soft tissues are otherwise unremarkable. There is minimal degenerative changes of the spine. No acute fracture. IMPRESSION: Acute pancreatitis.  No abscess. Electronically Signed   By: Elgie Collard M.D.   On: 08/19/2015 20:26   I have personally reviewed and  evaluated these images and lab results as part of my medical decision-making.   EKG Interpretation None      MDM   Final diagnoses:  Idiopathic acute pancreatitis    Pancreatitis. Tolerating PO. Lipase normal. Benign abdomen.  I discussed with gastroenterology, Dr. Jena Gauss, who will see the patient in the office for further workup. Pain and nausea medication given to the patient he will decreased by mouth intake and attempt bowel rest while taking the pain medicines and if is unable to do his a hole return here for further evaluation.  New Prescriptions: Discharge Medication List as of 08/19/2015 10:51 PM    START taking these medications   Details  ondansetron (ZOFRAN) 4 mg TABS tablet Take 4 tablets by mouth every 8 (eight) hours as needed., Starting 08/19/2015, Until Discontinued, Print    !! oxyCODONE-acetaminophen (PERCOCET) 5-325 MG tablet Take 2 tablets by mouth every 4 (four) hours as needed., Starting 08/19/2015, Until Discontinued, Print    !! oxyCODONE-acetaminophen (PERCOCET/ROXICET) 5-325 MG tablet Take 2 tablets by mouth every 4 (four) hours as needed for severe pain., Starting 08/19/2015, Until Discontinued, Print     !! - Potential duplicate medications found. Please discuss with provider.  I have personally and contemperaneously reviewed labs and imaging and used in my decision making as above.   A medical screening exam was performed and I feel the patient has had an appropriate workup for their chief complaint at this time and likelihood of emergent condition existing is low. Their vital signs are stable. They have been counseled on decision, discharge, follow up and which symptoms necessitate immediate return to the emergency department.  They verbally stated understanding and agreement with plan and discharged in stable condition.    Marily MemosJason Takota Cahalan, MD 08/20/15 [phone]

## 2015-08-19 NOTE — ED Notes (Addendum)
Pt reports abd pain and cramping sensation since this am. Pt denies v/d. nad noted. LBM 08/19/15.

## 2015-08-20 ENCOUNTER — Telehealth: Payer: Self-pay | Admitting: Nurse Practitioner

## 2015-08-20 NOTE — Telephone Encounter (Signed)
Pt called to say he was in the ED last night and was told to call us to schedule an OV ASAP. Please advise. He will be a new patient. [phone]

## 2015-08-20 NOTE — Telephone Encounter (Signed)
No previous GI in the system, was previously scheduled with us and a no-show. Ok to schedule an appointment in the next available regular slot with any provider.

## 2015-08-23 NOTE — Telephone Encounter (Signed)
Pt called office and has appt with Gerrit HallsAnna Sams on 04/05

## 2015-08-25 ENCOUNTER — Ambulatory Visit (INDEPENDENT_AMBULATORY_CARE_PROVIDER_SITE_OTHER): Payer: 59 | Admitting: Gastroenterology

## 2015-08-25 ENCOUNTER — Other Ambulatory Visit (HOSPITAL_COMMUNITY): Payer: Self-pay

## 2015-08-25 ENCOUNTER — Encounter: Payer: Self-pay | Admitting: Gastroenterology

## 2015-08-25 ENCOUNTER — Other Ambulatory Visit: Payer: Self-pay

## 2015-08-25 VITALS — BP 135/83 | HR 62 | Temp 97.6°F | Ht 71.0 in | Wt 252.8 lb

## 2015-08-25 DIAGNOSIS — R1011 Right upper quadrant pain: Secondary | ICD-10-CM | POA: Insufficient documentation

## 2015-08-25 DIAGNOSIS — K859 Acute pancreatitis without necrosis or infection, unspecified: Secondary | ICD-10-CM | POA: Diagnosis not present

## 2015-08-25 NOTE — Progress Notes (Signed)
Primary Care Physician:  Pershing ProudJACKSON,SAMANTHA, PA-C Primary Gastroenterologist:  Dr. Jena Gaussourk  Chief Complaint  Patient presents with  . Abdominal Pain    HPI:   Jeff Buck is a 40 y.o. male presenting today at the request of the ED secondary to pancreatitis. He has a history of chronic, episodic abdominal pain similar to this presentation, stating [facility] past. This is not available through Care Everywhere. He is not sure when this was.   Pain at time of ED visit was diffuse abdomen upper abdomen. This has resolved. Now it's just the RUQ/right-sided. Gallbladder remains in situ. No ETOH. No drug use. No FH of pancreatitis. Soft diet now. Ate 5 chicken nuggets on Sunday. Eating saltines, crackers, fruit cups. Symptoms date back to at least 2013. CT abd/pelvis in 2013 with mild acute pancreatitis. Mild acute pancreatitis in March 2017 via CT. US abdomen last completed in 2012 and was normal. States his triglycerides are normal through his PCP a few months ago. We will request this. Girlfriend present with him today.    Past Medical History  Diagnosis Date  . Hypertension   . Sinusitis   . Anxiety   . Pancreatitis     Past Surgical History  Procedure Laterality Date  . Orthopedic surgery      left knee surgery    Current Outpatient Prescriptions  Medication Sig Dispense Refill  . lisinopril (PRINIVIL,ZESTRIL) 20 MG tablet Take 20 mg by mouth daily.     . ondansetron (ZOFRAN) 4 mg TABS tablet Take 4 tablets by mouth every 8 (eight) hours as needed. 4 tablet 0  . oxyCODONE-acetaminophen (PERCOCET) 5-325 MG tablet Take 2 tablets by mouth every 4 (four) hours as needed. 20 tablet 0  . promethazine (PHENERGAN) 25 MG tablet Take 1 tablet (25 mg total) by mouth every 6 (six) hours as needed for nausea or vomiting. 30 tablet 0   No current facility-administered medications for this visit.    Allergies as of 08/25/2015 - Review Complete  08/25/2015  Allergen Reaction Noted  . Doxycycline Swelling 12/09/2012  . Hctz [hydrochlorothiazide]  08/19/2015    Family History  Problem Relation Age of Onset  . Heart failure Other   . Colon cancer Neg Hx     Social History   Social History  . Marital Status: Single    Spouse Name: N/A  . Number of Children: N/A  . Years of Education: N/A   Occupational History  . Employed    Social History Main Topics  . Smoking status: Current Some Day Smoker    Types: Cigarettes    Last Attempt to Quit: 05/22/2010  . Smokeless tobacco: Current User  . Alcohol Use: No  . Drug Use: No  . Sexual Activity: Not Currently   Other Topics Concern  . Not on file   Social History Narrative    Review of Systems: Gen: Denies any fever, chills, fatigue, weight loss, lack of appetite.  CV: Denies chest pain, heart palpitations, peripheral edema, syncope.  Resp: Denies shortness of breath at rest or with exertion. Denies wheezing or cough.  GI: see HPI  GU : Denies urinary burning, urinary frequency, urinary hesitancy MS: +joint pain  Derm: Denies rash, itching, dry skin Psych: Denies depression, anxiety, memory loss, and confusion Heme: Denies bruising, bleeding, and enlarged lymph nodes.  Physical Exam: BP 135/83 mmHg  Pulse 62  Temp(Src) 97.6 F (36.4 C) (Oral)  Ht 5\' 11"  (1.803  m)  Wt 252 lb 12.8 oz (114.669 kg)  BMI 35.27 kg/m2 General:   Alert and oriented. Pleasant and cooperative. Well-nourished and well-developed.  Head:  Normocephalic and atraumatic. Eyes:  Without icterus, sclera clear and conjunctiva pink.  Ears:  Normal auditory acuity. Nose:  No deformity, discharge,  or lesions. Mouth:  No deformity or lesions, oral mucosa pink.  Lungs:  Clear to auscultation bilaterally. No wheezes, rales, or rhonchi. No distress.  Heart:  S1, S2 present without murmurs appreciated.  Abdomen:  +BS, soft, TTP LUQ/LLQ, moreso TTP RUQ but without rebound or guarding. Rectal:   Deferred  Extremities:  Without edema. Neurologic:  Alert and  oriented x4;  grossly normal neurologically. Psych:  Alert and cooperative. Normal mood and affect.  Lab Results  Component Value Date   WBC 11.5* 08/19/2015   HGB 13.6 08/19/2015   HCT 39.5 08/19/2015   MCV 82.5 08/19/2015   PLT 326 08/19/2015   Lab Results  Component Value Date   CREATININE 0.89 08/19/2015   BUN 14 08/19/2015   NA 141 08/19/2015   K 3.8 08/19/2015   CL 106 08/19/2015   CO2 27 08/19/2015   Lab Results  Component Value Date   ALT 34 08/19/2015   AST 29 08/19/2015   ALKPHOS 65 08/19/2015   BILITOT 1.1 08/19/2015    

## 2015-08-25 NOTE — Progress Notes (Signed)
cc'ed to pcp °

## 2015-08-25 NOTE — Assessment & Plan Note (Signed)
40 year old male with acute pancreatitis noted on CT on at least 2 separate occasions, with similar symptoms dating back chronically. Unclear etiology at this point. He denies ETOH use, LFTs normal, reportedly states his trigylcerides have been been normal, but he has not had a recent US abdomen. Today with RUQ, new since ED visit a few days ago. Does not appear acutely ill and is tolerating management as an outpatient. Will arrange stat US abdomen today and will discuss with Dr. Christella HartiganJacobs EUS vs MRCP depending on findings from US. I question if he has a chronic pancreatitis component at this point.

## 2015-08-25 NOTE — Patient Instructions (Addendum)
We have scheduled you for an ultrasound of your gallbladder today to see if it could be causing your pancreatitis.     Further recommendations to follow once this is completed!  Acute Pancreatitis Acute pancreatitis is a disease in which the pancreas becomes suddenly inflamed. The pancreas is a large gland located behind your stomach. The pancreas produces enzymes that help digest food. The pancreas also releases the hormones glucagon and insulin that help regulate blood sugar. Damage to the pancreas occurs when the digestive enzymes from the pancreas are activated and begin attacking the pancreas before being released into the intestine. Most acute attacks last a couple of days and can cause serious complications. Some people become dehydrated and develop low blood pressure. In severe cases, bleeding into the pancreas can lead to shock and can be life-threatening. The lungs, heart, and kidneys may fail. CAUSES  Pancreatitis can happen to anyone. In some cases, the cause is unknown. Most cases are caused by:  Alcohol abuse.  Gallstones. Other less common causes are:  Certain medicines.  Exposure to certain chemicals.  Infection.  Damage caused by an accident (trauma).  Abdominal surgery. SYMPTOMS   Pain in the upper abdomen that may radiate to the back.  Tenderness and swelling of the abdomen.  Nausea and vomiting. DIAGNOSIS  Your caregiver will perform a physical exam. Blood and stool tests may be done to confirm the diagnosis. Imaging tests may also be done, such as X-rays, CT scans, or an ultrasound of the abdomen. TREATMENT  Treatment usually requires a stay in the hospital. Treatment may include:  Pain medicine.  Fluid replacement through an intravenous line (IV).  Placing a tube in the stomach to remove stomach contents and control vomiting.  Not eating for 3 or 4 days. This gives your pancreas a rest, because enzymes are not being produced that can cause further  damage.  Antibiotic medicines if your condition is caused by an infection.  Surgery of the pancreas or gallbladder. HOME CARE INSTRUCTIONS   Follow the diet advised by your caregiver. This may involve avoiding alcohol and decreasing the amount of fat in your diet.  Eat smaller, more frequent meals. This reduces the amount of digestive juices the pancreas produces.  Drink enough fluids to keep your urine clear or pale yellow.  Only take over-the-counter or prescription medicines as directed by your caregiver.  Avoid drinking alcohol if it caused your condition.  Do not smoke.  Get plenty of rest.  Check your blood sugar at home as directed by your caregiver.  Keep all follow-up appointments as directed by your caregiver. SEEK MEDICAL CARE IF:   You do not recover as quickly as expected.  You develop new or worsening symptoms.  You have persistent pain, weakness, or nausea.  You recover and then have another episode of pain. SEEK IMMEDIATE MEDICAL CARE IF:   You are unable to eat or keep fluids down.  Your pain becomes severe.  You have a fever or persistent symptoms for more than 2 to 3 days.  You have a fever and your symptoms suddenly get worse.  Your skin or the white part of your eyes turn yellow (jaundice).  You develop vomiting.  You feel dizzy, or you faint.  Your blood sugar is high (over 300 mg/dL). MAKE SURE YOU:   Understand these instructions.  Will watch your condition.  Will get help right away if you are not doing well or get worse.   This information is not  intended to replace advice given to you by your health care provider. Make sure you discuss any questions you have with your health care provider.   Document Released: 05/08/2005 Document Revised: 11/07/2011 Document Reviewed: 08/17/2011 Elsevier Interactive Patient Education [employer]

## 2015-08-27 ENCOUNTER — Ambulatory Visit (HOSPITAL_COMMUNITY)
Admission: RE | Admit: 2015-08-27 | Discharge: 2015-08-27 | Disposition: A | Discharge status: Home / Self Care | Payer: 59 | Source: Ambulatory Visit | Attending: Gastroenterology | Admitting: Gastroenterology

## 2015-08-27 DIAGNOSIS — R1011 Right upper quadrant pain: Secondary | ICD-10-CM | POA: Insufficient documentation

## 2015-08-27 MED FILL — Ondansetron HCl Tab 4 MG: ORAL | Qty: 4 | Status: AC

## 2015-08-27 MED FILL — Oxycodone w/ Acetaminophen Tab 5-325 MG: ORAL | Qty: 6 | Status: AC

## 2015-09-01 ENCOUNTER — Telehealth: Payer: Self-pay

## 2015-09-01 NOTE — Telephone Encounter (Signed)
Pt called inquiring about US results. °

## 2015-09-01 NOTE — Progress Notes (Signed)
Quick Note:  No gallstones on US. Fatty liver. Let's refer him for an EUS with Dr. Christella HartiganJacobs. I question microlithiasis and I wonder about chronic pancreatitis. He may ultimately need a cholecystectomy, but let's go this route first. ______

## 2015-09-01 NOTE — Progress Notes (Signed)
Triglycerides are 238. Doubt this is culprit for pancreatitis, as it is not nearly high enough to be causing it. Will scan in.

## 2015-09-01 NOTE — Telephone Encounter (Signed)
Please see result note 

## 2015-09-06 ENCOUNTER — Other Ambulatory Visit: Payer: Self-pay

## 2015-09-06 DIAGNOSIS — K760 Fatty (change of) liver, not elsewhere classified: Secondary | ICD-10-CM

## 2015-09-07 ENCOUNTER — Other Ambulatory Visit: Payer: Self-pay

## 2015-09-07 ENCOUNTER — Telehealth: Payer: Self-pay

## 2015-09-07 DIAGNOSIS — K859 Acute pancreatitis without necrosis or infection, unspecified: Secondary | ICD-10-CM

## 2015-09-07 NOTE — Telephone Encounter (Signed)
He needs upper EUS, radial +/- linear for recurrent pancreatitis. Next available EUS Thursday with MAC sedation.          Thanks                ----- Message -----     From: Donata DuffPatty L Lewis, RN     Sent: 09/06/2015  4:09 PM      To: Rachael Feeaniel P Jacobs, MD                ----- Message -----     From: Reeves Forthustin T Shives     Sent: 09/06/2015  3:50 PM      To: Donata DuffPatty L Lewis, RN        [facility] is needing [facility]

## 2015-09-07 NOTE — Telephone Encounter (Signed)
Eus scheduled for 09/23/15 1130 am

## 2015-09-08 NOTE — Telephone Encounter (Signed)
Left message on machine to call back  

## 2015-09-09 ENCOUNTER — Encounter (HOSPITAL_COMMUNITY): Payer: Self-pay | Admitting: *Deleted

## 2015-09-09 NOTE — Telephone Encounter (Signed)
EUS scheduled, pt instructed and medications reviewed.  Patient instructions mailed to home.  Patient to call with any questions or concerns.  

## 2015-09-23 ENCOUNTER — Encounter (HOSPITAL_COMMUNITY)
Admission: RE | Disposition: A | Discharge status: Home / Self Care | Payer: Self-pay | Source: Ambulatory Visit | Attending: Gastroenterology

## 2015-09-23 ENCOUNTER — Ambulatory Visit (HOSPITAL_COMMUNITY)
Admission: RE | Admit: 2015-09-23 | Discharge: 2015-09-23 | Disposition: A | Discharge status: Home / Self Care | Payer: 59 | Source: Ambulatory Visit | Attending: Gastroenterology | Admitting: Gastroenterology

## 2015-09-23 ENCOUNTER — Ambulatory Visit (HOSPITAL_COMMUNITY): Payer: 59 | Admitting: Anesthesiology

## 2015-09-23 ENCOUNTER — Encounter (HOSPITAL_COMMUNITY): Payer: Self-pay

## 2015-09-23 DIAGNOSIS — I10 Essential (primary) hypertension: Secondary | ICD-10-CM | POA: Diagnosis not present

## 2015-09-23 DIAGNOSIS — R109 Unspecified abdominal pain: Secondary | ICD-10-CM | POA: Diagnosis present

## 2015-09-23 DIAGNOSIS — Z79891 Long term (current) use of opiate analgesic: Secondary | ICD-10-CM | POA: Diagnosis not present

## 2015-09-23 DIAGNOSIS — K859 Acute pancreatitis without necrosis or infection, unspecified: Secondary | ICD-10-CM | POA: Diagnosis not present

## 2015-09-23 DIAGNOSIS — K861 Other chronic pancreatitis: Secondary | ICD-10-CM | POA: Insufficient documentation

## 2015-09-23 DIAGNOSIS — Z79899 Other long term (current) drug therapy: Secondary | ICD-10-CM | POA: Diagnosis not present

## 2015-09-23 DIAGNOSIS — F17210 Nicotine dependence, cigarettes, uncomplicated: Secondary | ICD-10-CM | POA: Insufficient documentation

## 2015-09-23 HISTORY — PX: EUS: SHX5427

## 2015-09-23 HISTORY — DX: Adverse effect of unspecified anesthetic, initial encounter: T41.45XA

## 2015-09-23 HISTORY — DX: Other complications of anesthesia, initial encounter: T88.59XA

## 2015-09-23 SURGERY — UPPER ENDOSCOPIC ULTRASOUND (EUS) LINEAR
Anesthesia: Monitor Anesthesia Care

## 2015-09-23 MED ORDER — SODIUM CHLORIDE 0.9 % IV SOLN: INTRAVENOUS | Status: DC

## 2015-09-23 MED ORDER — PROPOFOL 10 MG/ML IV BOLUS
INTRAVENOUS | Status: AC
  Filled 2015-09-23: qty 40

## 2015-09-23 MED ORDER — KETAMINE HCL 50 MG/ML IJ SOLN
INTRAMUSCULAR | Status: DC | PRN
Start: 2015-09-23 — End: 2015-09-23
  Administered 2015-09-23: 10 mg via INTRAMUSCULAR
  Administered 2015-09-23: 40 mg via INTRAMUSCULAR

## 2015-09-23 MED ORDER — PROPOFOL 10 MG/ML IV BOLUS
INTRAVENOUS | Status: DC | PRN
  Administered 2015-09-23: 50 mg via INTRAVENOUS

## 2015-09-23 MED ORDER — PROPOFOL 500 MG/50ML IV EMUL
INTRAVENOUS | Status: DC | PRN
Start: 2015-09-23 — End: 2015-09-23
  Administered 2015-09-23: 100 ug/kg/min via INTRAVENOUS

## 2015-09-23 MED ORDER — LACTATED RINGERS IV SOLN
INTRAVENOUS | Status: DC
  Administered 2015-09-23: 08:00:00 via INTRAVENOUS

## 2015-09-23 MED ORDER — KETAMINE HCL 10 MG/ML IJ SOLN
INTRAMUSCULAR | Status: AC
  Filled 2015-09-23: qty 1

## 2015-09-23 NOTE — Discharge Instructions (Signed)

## 2015-09-23 NOTE — Op Note (Signed)
[facility] Patient Name: Jeff Buck Procedure Date: 09/23/2015 MRN: [id number] Attending MD: [name] , MD Date of Birth: 1976-01-11 CSN: [id number] Age: 40 Admit Type: Outpatient Procedure:                Upper EUS Indications:              Acute recurrent pancreatitis Providers:                [name], MD, Michel Bickers, RN, Arlee Muslim, Technician Referring MD:             Gerrit Halls, NP Medicines:                Monitored Anesthesia Care Complications:            No immediate complications. Estimated blood loss:                            None. Estimated Blood Loss:     Estimated blood loss: none. Procedure:                Pre-Anesthesia Assessment:                           - Prior to the procedure, a History and Physical                            was performed, and patient medications and                            allergies were reviewed. The patient's tolerance of                            previous anesthesia was also reviewed. The risks                            and benefits of the procedure and the sedation                            options and risks were discussed with the patient.                            All questions were answered, and informed consent                            was obtained. Prior Anticoagulants: The patient has                            taken no previous anticoagulant or antiplatelet                            agents. ASA Grade Assessment: II - A patient with  mild systemic disease. After reviewing the risks                            and benefits, the patient was deemed in                            satisfactory condition to undergo the procedure.                           After obtaining informed consent, the endoscope was                            passed under direct vision. Throughout the                            procedure, the patient's blood  pressure, pulse, and                            oxygen saturations were monitored continuously. The                            [id number] ([id number]) scope was introduced through                            the mouth, and advanced to the second part of                            duodenum. The upper EUS was accomplished without                            difficulty. The patient tolerated the procedure                            well. Scope In: Scope Out: Findings:      Endoscopic Finding :      The examined esophagus was endoscopically normal.      The entire examined stomach was endoscopically normal.      The examined duodenum was endoscopically normal.      Endosonographic Finding :      1. There was no sign of significant endosonographic abnormality in the       pancreas. No masses, no cysts, no calcifications, the pancreatic duct       was regular in contour. This examination did not rule out pancreatic       divisum.      2. CBD was normal; non-dilated and contained no stones      3. Gallbladder appeared normal      4. No peripancreatic adenopathy      5. Limited views of liver, spleen, portal and splenic vessels were all       normal. Impression:               The gallbladder, extrahepatic biliary tree and                            pancreas were normal appearing. Moderate Sedation:      N/A- Per  Anesthesia Care Recommendation:           Should consider elective gallbladder resection                            (?microlithiasis causing recurrent acute                            pancreatitis)                           Discharge patient to home (ambulatory). Procedure Code(s):        --- Professional ---                           [id number], Esophagogastroduodenoscopy, flexible,                            transoral; with endoscopic ultrasound examination                            limited to the esophagus, stomach or duodenum, and                            adjacent  structures Diagnosis Code(s):        --- Professional ---                           K85.90, Acute pancreatitis without necrosis or                            infection, unspecified CPT copyright 2016 American Medical Association. All rights reserved. The codes documented in this report are preliminary and upon coder review may  be revised to meet current compliance requirements. Rachael Feeaniel P Arianne Klinge, MD 09/23/2015 9:39:42 AM This report has been signed electronically. Number of Addenda: 0

## 2015-09-23 NOTE — Interval H&P Note (Signed)
History and Physical Interval Note:  09/23/2015 9:06 AM  Jeff Buck  has presented today for surgery, with the diagnosis of recurrent pancreatitis  The various methods of treatment have been discussed with the patient and family. After consideration of risks, benefits and other options for treatment, the patient has consented to  Procedure(s): UPPER ENDOSCOPIC ULTRASOUND (EUS) LINEAR (N/A) as a surgical intervention .  The patient's history has been reviewed, patient examined, no change in status, stable for surgery.  I have reviewed the patient's chart and labs.  Questions were answered to the patient's satisfaction.     Rachael FeeJacobs, Raidon Swanner P

## 2015-09-23 NOTE — Progress Notes (Signed)
Patients rash on chest is clearing eyes are still swallon

## 2015-09-23 NOTE — Addendum Note (Signed)
Addendum  created 09/23/15 1044 by Karlyne GreenspanBenjamin Katalin Colledge, MD   Modules edited: Clinical Notes   Clinical Notes:  File: [id number]

## 2015-09-23 NOTE — Progress Notes (Signed)
Patient came out of procedure developed a rash on chest and face informed CRNA. CRNA checked the patient and he had no trouble breathing and no signs of breathing trouble.

## 2015-09-23 NOTE — H&P (View-Only) (Signed)
Primary Care Physician:  Pershing ProudJACKSON,SAMANTHA, PA-C Primary Gastroenterologist:  Dr. Jena Gaussourk  Chief Complaint  Patient presents with  . Abdominal Pain    HPI:   Jeff Buck is a 40 y.o. male presenting today at the request of the ED secondary to pancreatitis. He has a history of chronic, episodic abdominal pain similar to this presentation, stating [facility]. This is not available through Care Everywhere. He is not sure when this was.   Pain at time of ED visit was diffuse abdomen upper abdomen. This has resolved. Now it's just the RUQ/right-sided. Gallbladder remains in situ. No ETOH. No drug use. No FH of pancreatitis. Soft diet now. Ate 5 chicken nuggets on Sunday. Eating saltines, crackers, fruit cups. Symptoms date back to at least 2013. CT abd/pelvis in 2013 with mild acute pancreatitis. Mild acute pancreatitis in March 2017 via CT. US abdomen last completed in 2012 and was normal. States his triglycerides are normal through his PCP a few months ago. We will request this. Girlfriend present with him today.    Past Medical History  Diagnosis Date  . Hypertension   . Sinusitis   . Anxiety   . Pancreatitis     Past Surgical History  Procedure Laterality Date  . Orthopedic surgery      left knee surgery    Current Outpatient Prescriptions  Medication Sig Dispense Refill  . lisinopril (PRINIVIL,ZESTRIL) 20 MG tablet Take 20 mg by mouth daily.     . ondansetron (ZOFRAN) 4 mg TABS tablet Take 4 tablets by mouth every 8 (eight) hours as needed. 4 tablet 0  . oxyCODONE-acetaminophen (PERCOCET) 5-325 MG tablet Take 2 tablets by mouth every 4 (four) hours as needed. 20 tablet 0  . promethazine (PHENERGAN) 25 MG tablet Take 1 tablet (25 mg total) by mouth every 6 (six) hours as needed for nausea or vomiting. 30 tablet 0   No current facility-administered medications for this visit.    Allergies as of 08/25/2015 - Review Complete  08/25/2015  Allergen Reaction Noted  . Doxycycline Swelling 12/09/2012  . Hctz [hydrochlorothiazide]  08/19/2015    Family History  Problem Relation Age of Onset  . Heart failure Other   . Colon cancer Neg Hx     Social History   Social History  . Marital Status: Single    Spouse Name: N/A  . Number of Children: N/A  . Years of Education: N/A   Occupational History  . Employed    Social History Main Topics  . Smoking status: Current Some Day Smoker    Types: Cigarettes    Last Attempt to Quit: 05/22/2010  . Smokeless tobacco: Current User  . Alcohol Use: No  . Drug Use: No  . Sexual Activity: Not Currently   Other Topics Concern  . Not on file   Social History Narrative    Review of Systems: Gen: Denies any fever, chills, fatigue, weight loss, lack of appetite.  CV: Denies chest pain, heart palpitations, peripheral edema, syncope.  Resp: Denies shortness of breath at rest or with exertion. Denies wheezing or cough.  GI: see HPI  GU : Denies urinary burning, urinary frequency, urinary hesitancy MS: +joint pain  Derm: Denies rash, itching, dry skin Psych: Denies depression, anxiety, memory loss, and confusion Heme: Denies bruising, bleeding, and enlarged lymph nodes.  Physical Exam: BP 135/83 mmHg  Pulse 62  Temp(Src) 97.6 F (36.4 C) (Oral)  Ht 5\' 11"  (1.803  m)  Wt 252 lb 12.8 oz (114.669 kg)  BMI 35.27 kg/m2 General:   Alert and oriented. Pleasant and cooperative. Well-nourished and well-developed.  Head:  Normocephalic and atraumatic. Eyes:  Without icterus, sclera clear and conjunctiva pink.  Ears:  Normal auditory acuity. Nose:  No deformity, discharge,  or lesions. Mouth:  No deformity or lesions, oral mucosa pink.  Lungs:  Clear to auscultation bilaterally. No wheezes, rales, or rhonchi. No distress.  Heart:  S1, S2 present without murmurs appreciated.  Abdomen:  +BS, soft, TTP LUQ/LLQ, moreso TTP RUQ but without rebound or guarding. Rectal:   Deferred  Extremities:  Without edema. Neurologic:  Alert and  oriented x4;  grossly normal neurologically. Psych:  Alert and cooperative. Normal mood and affect.  Lab Results  Component Value Date   WBC 11.5* 08/19/2015   HGB 13.6 08/19/2015   HCT 39.5 08/19/2015   MCV 82.5 08/19/2015   PLT 326 08/19/2015   Lab Results  Component Value Date   CREATININE 0.89 08/19/2015   BUN 14 08/19/2015   NA 141 08/19/2015   K 3.8 08/19/2015   CL 106 08/19/2015   CO2 27 08/19/2015   Lab Results  Component Value Date   ALT 34 08/19/2015   AST 29 08/19/2015   ALKPHOS 65 08/19/2015   BILITOT 1.1 08/19/2015

## 2015-09-23 NOTE — Anesthesia Preprocedure Evaluation (Signed)
Anesthesia Evaluation  Patient identified by MRN, date of birth, ID band Patient awake    Reviewed: Allergy & Precautions, H&P , NPO status , Patient's Chart, lab work & pertinent test results  History of Anesthesia Complications Negative for: history of anesthetic complications  Airway Mallampati: II  TM Distance: >3 FB Neck ROM: full    Dental no notable dental hx.    Pulmonary Current Smoker,    Pulmonary exam normal breath sounds clear to auscultation       Cardiovascular hypertension, Pt. on medications Normal cardiovascular exam Rhythm:regular Rate:Normal     Neuro/Psych Anxiety negative neurological ROS     GI/Hepatic Neg liver ROS, Recurrent pancreatitis   Endo/Other  negative endocrine ROS  Renal/GU negative Renal ROS     Musculoskeletal   Abdominal   Peds  Hematology negative hematology ROS (+)   Anesthesia Other Findings   Reproductive/Obstetrics negative OB ROS                             Anesthesia Physical Anesthesia Plan  ASA: II  Anesthesia Plan: MAC   Post-op Pain Management:    Induction: Intravenous  Airway Management Planned: Nasal Cannula  Additional Equipment:   Intra-op Plan:   Post-operative Plan:   Informed Consent: I have reviewed the patients History and Physical, chart, labs and discussed the procedure including the risks, benefits and alternatives for the proposed anesthesia with the patient or authorized representative who has indicated his/her understanding and acceptance.   Dental Advisory Given  Plan Discussed with: Anesthesiologist, CRNA and Surgeon  Anesthesia Plan Comments:         Anesthesia Quick Evaluation

## 2015-09-23 NOTE — Anesthesia Postprocedure Evaluation (Addendum)
Anesthesia Post Note  Patient: Laban EmperorWilliam C Forstner  Procedure(s) Performed: Procedure(s) (LRB): UPPER ENDOSCOPIC ULTRASOUND (EUS) LINEAR (N/A)  Patient location during evaluation: PACU Anesthesia Type: MAC Level of consciousness: awake and alert Pain management: pain level controlled Vital Signs Assessment: post-procedure vital signs reviewed and stable Respiratory status: spontaneous breathing, nonlabored ventilation, respiratory function stable and patient connected to nasal cannula oxygen Cardiovascular status: stable and blood pressure returned to baseline Anesthetic complications: no Comments: Mr. Dayton ScrapeMurray does demonstrate signs of cutaneous allergic reaction that is limited to his face, eyes and a mild urticarial rash that is receding from his chest. He denies shortness of breath, is HR is 70 sinus rhythm, BP is baseline in 130s systolic. I have offered benadryl and a dose of steroid but he denies the medications. He wishes to go home. After 1 hour of observation with improved condition this is suitable. He is given specific instructions to return to the ED immediately with worsening swelling or shortness of breath/difficulty breathing.     Last Vitals:  Filed Vitals:   09/23/15 0957 09/23/15 1007  BP: 170/98 145/97  Pulse: 92 73  Temp:    Resp: 23 15    Last Pain: There were no vitals filed for this visit.               Reino KentJudd, Faizan Geraci J

## 2015-09-23 NOTE — Transfer of Care (Signed)
Immediate Anesthesia Transfer of Care Note  Patient: Jeff Buck  Procedure(s) Performed: Procedure(s): UPPER ENDOSCOPIC ULTRASOUND (EUS) LINEAR (N/A)  Patient Location: PACU  Anesthesia Type:MAC  Level of Consciousness:  sedated, patient cooperative and responds to stimulation  Airway & Oxygen Therapy:Patient Spontanous Breathing and Patient connected to face mask oxgen  Post-op Assessment:  Report given to PACU RN and Post -op Vital signs reviewed and stable  Post vital signs:  Reviewed and stable  Last Vitals:  Filed Vitals:   09/23/15 0809  BP: 134/94  Pulse: 65  Temp: 36.7 C  Resp: 18    Complications: No apparent anesthesia complications

## 2015-09-26 ENCOUNTER — Encounter (HOSPITAL_COMMUNITY): Payer: Self-pay | Admitting: Gastroenterology

## 2015-09-28 ENCOUNTER — Telehealth: Payer: Self-pay | Admitting: Gastroenterology

## 2015-09-28 NOTE — Telephone Encounter (Signed)
Yes, i did write him a note on a prescription pad from [facility] end[facility].  No further out of work than that however.  Is his face still swollen?

## 2015-09-28 NOTE — Telephone Encounter (Signed)
The pt states he was given a handwritten note to be out of work until 09/26/15 on 09/23/15.  Dr Christella HartiganJacobs did you write this note?  I can put one in the system so it will be documented

## 2015-09-29 NOTE — Telephone Encounter (Signed)
Call placed to the pt, however, voice mail has not been set up.  Will call pt later today

## 2015-09-30 NOTE — Telephone Encounter (Signed)
Call placed to the pt voice mail has not been set up.

## 2015-10-01 NOTE — Telephone Encounter (Signed)
Unable to reach pt will wait for further communication from the pt.   

## 2015-10-04 ENCOUNTER — Telehealth: Payer: Self-pay

## 2015-10-04 NOTE — Telephone Encounter (Signed)
Pt called and states that Dr. Christella HartiganJacobs did EUS and need to have his gallbladder removed. Please advise.

## 2015-10-05 ENCOUNTER — Other Ambulatory Visit: Payer: Self-pay

## 2015-10-05 DIAGNOSIS — Z719 Counseling, unspecified: Secondary | ICD-10-CM

## 2015-10-05 DIAGNOSIS — R109 Unspecified abdominal pain: Secondary | ICD-10-CM

## 2015-10-05 DIAGNOSIS — K858 Other acute pancreatitis without necrosis or infection: Secondary | ICD-10-CM

## 2015-10-05 NOTE — Telephone Encounter (Signed)
[facility]

## 2015-10-05 NOTE — Telephone Encounter (Signed)
Great! Yes, Dr. Christella HartiganJacobs notified me that EUS was completed and no abnormal findings. Next step would be elective cholecystectomy.   Please refer to Dr. [facility]. I believe he has another surgical partner there now as well. Not sure if he is accepting new patients yet or not.

## 2015-12-10 NOTE — H&P (Signed)
Surgical History & Physical  Subjective: 40 year old male initially presented forrecurrent episodes of acute pancreatitis attributed to microcholelithiasis by GI following an extensive workup including CT, MRCP, endoscopic ultrasound, and transabdominal ultrasound (which demonstrated no gallstones), some of which was performed at [facility] and some of whih was performed at [facility]. Patient denied regular EtOH consumption, LFT's and triglycerides have been normal, and he reported his last episode was about 6 months ago. He denied at that time any abdominal pain, N/V, CP, SOB, or fever/chills.  He then presented for followup with no significant complaints and has minimized fatty foods from his diet and abstained from any alcohol since his previous appointment with no recurrence of pancreatitis or abdominal pain.  Review of Symptoms:  Constitutional:No fevers, chills, or unexplained weight loss Head:Atraumatic; no masses; no abnormalities Eyes: No visual changes or eye pain Nose/Mouth/Throat: No nasal congestion, rhinorrhea, oral lesions, postnasal drip or sore throat  Cardiovascular: No chest pain or palpitations.  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   Social History  Preferred Language: English Race:  White Ethnicity: Not Hispanic / Latino Age: 40 year Marital Status:  D Children:  Child 1  Age  Occupation:  Maintenance Nutrition:  minimizes red meat, reports prioritizing fruits, vegetables, and whole grains Alcohol: Yes, How much 6 - 7 in one day socially, then none for several months  Smoking Status: smokes 1 pack every other week, previously quit smoking and switched to chewing tobacco before returning to smoking  Functional Status ------------------------------------------------ Bathing: Normal Cooking: Normal Dressing: Normal Driving: Normal Eating: Normal Managing Meds: Normal Oral Care: Normal Shopping: Normal Toileting: Normal Transferring: Normal Walking: Normal  Cognitive Status ------------------------------------------------ Attention: Normal Decision Making: Normal Language: Normal Memory: Normal Motor: Normal Perception: Normal Problem Solving: Normal Visual and Spatial: Normal   Family Health History Mother, Healthy;  Father, Healthy;  Other Family Member, Unknown; Heart failure;   Vitals as of 12/07/2015:  Systolic 138: Diastolic 95: Heart Rate 77: Temp 37.06C (Temporal) Height 180CM: Weight 115.67KG: BMI 35.56   Physical Exam: General:Well appearing, well nourished in no distress. Skin:no rash or prominent lesions Head:Atraumatic; no masses; no abnormalities Eyes:conjunctiva clear, EOM intact, PERRL Mouth:Mucous membranes moist, no mucosal lesions. Throat:no erythema, exudates or lesions. Neck:Supple without lymphadenopathy.  Heart:RRR, no murmur Lungs:CTA bilaterally, no wheezes, rhonchi, rales.  Breathing unlabored. Abdomen:Overweight but not obese, soft, NT/ND, no HSM, no masses. Extremities:No deformities, clubbing, cyanosis, or edema.   Assessment/Plan: 40 year old Male with recurrent pancreatitis attributed by GI specialist to microcholelithiasis following comprehensive workup including EUS.   - Patient again expresses understanding that imaging studies do not demonstrate gallstones, but in the context of no other  identifiable etiology of pancreatitis following extensive workup, GI specialist suggests microcholelithiasis following EUS and that surgery (cholecystectomy) in this context may be diagnostic as much as potentially therapeutic. He also verifies understanding that he may still experience pancreatitis, considering its unclear etiology, even after surgical removal of gallbladder.  - All risks, benefits, and alternatives to laparoscopic cholecystectomy, possible open, possible cholangiogram were discussed with the patient, and all of  his questions were answered to his expressed satisfaction  - Smoking cessation was again discussed and his efforts to quit applauded and encouraged, both in his interest of improving and maintaining good health as well as to reduce peri-operative risks including wound and pulmonary complications  - Will plan for elective outpatient cholecystectomy next Wednesday, 7/26  - follow-up in office 2 weeks after surgery  -- Barbara CowerJason E. Earlene Plateravis, MD, RPVI [place]: [facility] Office #: [phone]

## 2015-12-11 ENCOUNTER — Encounter (HOSPITAL_COMMUNITY): Payer: Self-pay | Admitting: Emergency Medicine

## 2015-12-11 ENCOUNTER — Emergency Department (HOSPITAL_COMMUNITY)
Admission: EM | Admit: 2015-12-11 | Discharge: 2015-12-11 | Disposition: A | Discharge status: Home / Self Care | Payer: 59 | Attending: Emergency Medicine | Admitting: Emergency Medicine

## 2015-12-11 DIAGNOSIS — Z79899 Other long term (current) drug therapy: Secondary | ICD-10-CM | POA: Diagnosis not present

## 2015-12-11 DIAGNOSIS — F17210 Nicotine dependence, cigarettes, uncomplicated: Secondary | ICD-10-CM | POA: Insufficient documentation

## 2015-12-11 DIAGNOSIS — R1013 Epigastric pain: Secondary | ICD-10-CM

## 2015-12-11 DIAGNOSIS — I10 Essential (primary) hypertension: Secondary | ICD-10-CM | POA: Diagnosis not present

## 2015-12-11 LAB — CBC WITH DIFFERENTIAL/PLATELET
BASOS ABS: 0.1 10*3/uL (ref 0.0–0.1)
BASOS PCT: 0 %
EOS ABS: 0.4 10*3/uL (ref 0.0–0.7)
EOS PCT: 4 %
HCT: 40.5 % (ref 39.0–52.0)
Hemoglobin: 14 g/dL (ref 13.0–17.0)
Lymphocytes Relative: 29 %
Lymphs Abs: 3.3 10*3/uL (ref 0.7–4.0)
MCH: 28.6 pg (ref 26.0–34.0)
MCHC: 34.6 g/dL (ref 30.0–36.0)
MCV: 82.7 fL (ref 78.0–100.0)
MONO ABS: 1.4 10*3/uL — AB (ref 0.1–1.0)
MONOS PCT: 12 %
NEUTROS ABS: 6.3 10*3/uL (ref 1.7–7.7)
Neutrophils Relative %: 55 %
PLATELETS: 340 10*3/uL (ref 150–400)
RBC: 4.9 MIL/uL (ref 4.22–5.81)
RDW: 13 % (ref 11.5–15.5)
WBC: 11.4 10*3/uL — ABNORMAL HIGH (ref 4.0–10.5)

## 2015-12-11 LAB — COMPREHENSIVE METABOLIC PANEL
ALBUMIN: 4.2 g/dL (ref 3.5–5.0)
ALK PHOS: 78 U/L (ref 38–126)
ALT: 30 U/L (ref 17–63)
ANION GAP: 3 — AB (ref 5–15)
AST: 21 U/L (ref 15–41)
BILIRUBIN TOTAL: 0.5 mg/dL (ref 0.3–1.2)
BUN: 13 mg/dL (ref 6–20)
CALCIUM: 8.6 mg/dL — AB (ref 8.9–10.3)
CO2: 25 mmol/L (ref 22–32)
Chloride: 109 mmol/L (ref 101–111)
Creatinine, Ser: 0.99 mg/dL (ref 0.61–1.24)
Glucose, Bld: 128 mg/dL — ABNORMAL HIGH (ref 65–99)
Potassium: 3.6 mmol/L (ref 3.5–5.1)
SODIUM: 137 mmol/L (ref 135–145)
TOTAL PROTEIN: 7.2 g/dL (ref 6.5–8.1)

## 2015-12-11 LAB — LIPASE, BLOOD: LIPASE: 44 U/L (ref 11–51)

## 2015-12-11 MED ORDER — MORPHINE SULFATE (PF) 2 MG/ML IV SOLN
2.0000 mg | Freq: Once | INTRAVENOUS | Status: AC
  Administered 2015-12-11: 2 mg via INTRAVENOUS
  Filled 2015-12-11: qty 1

## 2015-12-11 MED ORDER — SODIUM CHLORIDE 0.9 % IV BOLUS (SEPSIS)
1000.0000 mL | Freq: Once | INTRAVENOUS | Status: AC
  Administered 2015-12-11: 1000 mL via INTRAVENOUS

## 2015-12-11 MED ORDER — METOCLOPRAMIDE HCL 5 MG/ML IJ SOLN
10.0000 mg | Freq: Once | INTRAMUSCULAR | Status: AC
  Administered 2015-12-11: 10 mg via INTRAVENOUS
  Filled 2015-12-11: qty 2

## 2015-12-11 MED ORDER — GI COCKTAIL ~~LOC~~
30.0000 mL | Freq: Once | ORAL | Status: AC
  Administered 2015-12-11: 30 mL via ORAL
  Filled 2015-12-11: qty 30

## 2015-12-11 MED ORDER — ONDANSETRON HCL 4 MG/2ML IJ SOLN
4.0000 mg | Freq: Once | INTRAMUSCULAR | Status: AC
  Administered 2015-12-11: 4 mg via INTRAVENOUS
  Filled 2015-12-11: qty 2

## 2015-12-11 MED ORDER — METOCLOPRAMIDE HCL 10 MG PO TABS: 10.0000 mg | ORAL_TABLET | Freq: Four times a day (QID) | ORAL | Status: DC

## 2015-12-11 NOTE — Discharge Instructions (Signed)

## 2015-12-11 NOTE — ED Notes (Signed)
Patient c/o left upper abd pain with nausea. Denies any vomiting or diarrhea. Per patient has pancreatitis and is also scheduled to have gallbladder removed Wednesday.

## 2015-12-11 NOTE — ED Provider Notes (Signed)
CSN: [id number]     Arrival date & time 12/11/15  1700 History   First MD Initiated Contact with Patient 12/11/15 1743     Chief Complaint  Patient presents with  . Abdominal Pain     (Consider location/radiation/quality/duration/timing/severity/associated sxs/prior Treatment) HPI 40 year old male history of pancreatitis presents today complaining of ongoing epigastric pain for the past 3 days. He states that it began 3 days ago and has been worsening. It is in the epigastric region and radiates through to his back. It is like previous episodes of pancreatitis. He has had nausea but no vomiting. He has taken over-the-counter medicines without relief. He has been evaluated for gallbladder surgery to be done this Wednesday. He has not had any obvious gallstones seen on ultrasound. He has had upper endoscopy Past Medical History  Diagnosis Date  . Hypertension   . Sinusitis   . Anxiety   . Pancreatitis   . Complication of anesthesia yrs ago    woke up during knee surgery   Past Surgical History  Procedure Laterality Date  . Orthopedic surgery      left knee surgery  . Eus N/A 09/23/2015    Procedure: UPPER ENDOSCOPIC ULTRASOUND (EUS) LINEAR;  Surgeon: [name], MD;  Location: [facility];  Service: Endoscopy;  Laterality: N/A;   Family History  Problem Relation Age of Onset  . Heart failure Other   . Colon cancer Neg Hx    Social History  Substance Use Topics  . Smoking status: Current Some Day Smoker    Types: Cigarettes  . Smokeless tobacco: Former Neurosurgeon  . Alcohol Use: No    Review of Systems    Allergies  Doxycycline and Hctz  Home Medications   Prior to Admission medications   Medication Sig Start Date End Date Taking? Authorizing Provider  b complex vitamins tablet Take 1 tablet by mouth daily.   Yes Historical Provider, MD  eszopiclone (LUNESTA) 2 MG TABS tablet Take 2 mg by mouth at bedtime as needed for sleep. Take immediately before bedtime   Yes  Historical Provider, MD  Flaxseed, Linseed, (FLAX PO) Take 2 capsules by mouth daily before breakfast.   Yes Historical Provider, MD  Ginkgo Biloba 120 MG CAPS Take 120 mg by mouth daily.   Yes Historical Provider, MD  lisinopril (PRINIVIL,ZESTRIL) 20 MG tablet Take 20 mg by mouth daily.    Yes Historical Provider, MD  loratadine (ALLERGY RELIEF) 10 MG tablet Take 10 mg by mouth daily.   Yes Historical Provider, MD  VITAMIN E PO Take 1 capsule by mouth daily.   Yes Historical Provider, MD   BP 124/83 mmHg  Pulse 77  Temp(Src) 98.4 F (36.9 C) (Oral)  Resp 18  Ht 5\' 11"  (1.803 m)  Wt 111.131 kg  BMI 34.19 kg/m2  SpO2 100% Physical Exam  Constitutional: He is oriented to person, place, and time. He appears well-developed and well-nourished.  HENT:  Head: Normocephalic and atraumatic.  Right Ear: External ear normal.  Left Ear: External ear normal.  Nose: Nose normal.  Mouth/Throat: Oropharynx is clear and moist.  Eyes: Conjunctivae and EOM are normal. Pupils are equal, round, and reactive to light.  Neck: Normal range of motion. Neck supple.  Cardiovascular: Normal rate, regular rhythm, normal heart sounds and intact distal pulses.   Pulmonary/Chest: Effort normal and breath sounds normal. No respiratory distress. He has no wheezes. He exhibits no tenderness.  Abdominal: Soft. Bowel sounds are normal. He exhibits no distension and  no mass. There is tenderness. There is no guarding.    Musculoskeletal: Normal range of motion.  Neurological: He is alert and oriented to person, place, and time. He has normal reflexes. He exhibits normal muscle tone. Coordination normal.  Skin: Skin is warm and dry.  Psychiatric: He has a normal mood and affect. His behavior is normal. Judgment and thought content normal.  Nursing note and vitals reviewed.   ED Course  Procedures (including critical care time) Labs Review Labs Reviewed  CBC WITH DIFFERENTIAL/PLATELET - Abnormal; Notable for the  following:    WBC 11.4 (*)    Monocytes Absolute 1.4 (*)    All other components within normal limits  COMPREHENSIVE METABOLIC PANEL  LIPASE, BLOOD    Imaging Review No results found. I have personally reviewed and evaluated these images and lab results as part of my medical decision-making.   EKG Interpretation None      MDM   Final diagnoses:  Epigastric pain    40 year old male history of chronic pancreatitis presents tonight with epigastric pain. Patient better here after pain medicine, Reglan, and antiemetics. He is scheduled for cholecystectomy on Thursday. Is advised of return precautions and need for close follow-up with the understanding   Margarita Grizzle, MD 12/11/15 2256

## 2015-12-13 NOTE — Patient Instructions (Signed)
Jeff Buck  12/13/2015     @   Your procedure is scheduled on  12/15/2015   Report to [facility] at  700  A.M.  Call this number if you have problems the morning of surgery:  [phone]   Remember:  Do not eat food or drink liquids after midnight.  Take these medicines the morning of surgery with A SIP OF WATER  Lisinopril, loratadine, reglan.   Do not wear jewelry, make-up or nail polish.  Do not wear lotions, powders, or perfumes.  You may wear deoderant.  Do not shave 48 hours prior to surgery.  Men may shave face and neck.  Do not bring valuables to the hospital.  [facility] is not responsible for any belongings or valuables.  Contacts, dentures or bridgework may not be worn into surgery.  Leave your suitcase in the car.  After surgery it may be brought to your room.  For patients admitted to the hospital, discharge time will be determined by your treatment team.  Patients discharged the day of surgery will not be allowed to drive home.   Name and phone number of your driver:   family Special instructions:  none  Please read over the following fact sheets that you were given. Coughing and Deep Breathing, Surgical Site Infection Prevention, Anesthesia Post-op Instructions and Care and Recovery After Surgery       Laparoscopic Cholecystectomy Laparoscopic cholecystectomy is surgery to remove the gallbladder. The gallbladder is located in the upper right part of the abdomen, behind the liver. It is a storage sac for bile, which is produced in the liver. Bile aids in the digestion and absorption of fats. Cholecystectomy is often done for inflammation of the gallbladder (cholecystitis). This condition is usually caused by a buildup of gallstones (cholelithiasis) in the gallbladder. Gallstones can block the flow of bile, and that can result in inflammation and pain. In severe cases, emergency surgery may be required. If emergency  surgery is not required, you will have time to prepare for the procedure. Laparoscopic surgery is an alternative to open surgery. Laparoscopic surgery has a shorter recovery time. Your common bile duct may also need to be examined during the procedure. If stones are found in the common bile duct, they may be removed. LET [facility] CARE PROVIDER KNOW ABOUT:  Any allergies you have.  All medicines you are taking, including vitamins, herbs, eye drops, creams, and over-the-counter medicines.  Previous problems you or members of your family have had with the use of anesthetics.  Any blood disorders you have.  Previous surgeries you have had.  Any medical conditions you have. RISKS AND COMPLICATIONS Generally, this is a safe procedure. However, problems may occur, including:  Infection.  Bleeding.  Allergic reactions to medicines.  Damage to other structures or organs.  A stone remaining in the common bile duct.  A bile leak from the cyst duct that is clipped when your gallbladder is removed.  The need to convert to open surgery, which requires a larger incision in the abdomen. This may be necessary if your surgeon thinks that it is not safe to continue with a laparoscopic procedure. BEFORE THE PROCEDURE  Ask your health care provider about:  Changing or stopping your regular medicines. This is especially important if you are taking diabetes medicines or blood thinners.  Taking medicines such as aspirin and ibuprofen. These medicines can thin your blood. Do not take  these medicines before your procedure if your health care provider instructs you not to.  Follow instructions from your health care provider about eating or drinking restrictions.  Let your health care provider know if you develop a cold or an infection before surgery.  Plan to have someone take you home after the procedure.  Ask your health care provider how your surgical site will be marked or identified.  You  may be given antibiotic medicine to help prevent infection. PROCEDURE  To reduce your risk of infection:  Your health care team will wash or sanitize their hands.  Your skin will be washed with soap.  An IV tube may be inserted into one of your veins.  You will be given a medicine to make you fall asleep (general anesthetic).  A breathing tube will be placed in your mouth.  The surgeon will make several small cuts (incisions) in your abdomen.  A thin, lighted tube (laparoscope) that has a tiny camera on the end will be inserted through one of the small incisions. The camera on the laparoscope will send a picture to a TV screen (monitor) in the operating room. This will give the surgeon a good view inside your abdomen.  A gas will be pumped into your abdomen. This will expand your abdomen to give the surgeon more room to perform the surgery.  Other tools that are needed for the procedure will be inserted through the other incisions. The gallbladder will be removed through one of the incisions.  After your gallbladder has been removed, the incisions will be closed with stitches (sutures), staples, or skin glue.  Your incisions may be covered with a bandage (dressing). The procedure may vary among health care providers and hospitals. AFTER THE PROCEDURE  Your blood pressure, heart rate, breathing rate, and blood oxygen level will be monitored often until the medicines you were given have worn off.  You will be given medicines as needed to control your pain.   This information is not intended to replace advice given to you by your health care provider. Make sure you discuss any questions you have with your health care provider.   Document Released: 05/08/2005 Document Revised: 01/27/2015 Document Reviewed: 12/18/2012 Elsevier Interactive Patient Education 2016 Elsevier Inc.  Laparoscopic Cholecystectomy, Care After These instructions give you information about caring for yourself  after your procedure. Your doctor may also give you more specific instructions. Call your doctor if you have any problems or questions after your procedure. HOME CARE Incision Care  Follow instructions from your doctor about how to take care of your cuts from surgery (incisions). Make sure you:  Wash your hands with soap and water before you change your bandage (dressing). If you cannot use soap and water, use hand sanitizer.  Change your bandage as told by your doctor.  Leave stitches (sutures), skin glue, or skin tape (adhesive) strips in place. They may need to stay in place for 2 weeks or longer. If tape strips get loose and curl up, you may trim the loose edges. Do not remove tape strips completely unless your doctor says it is okay.  Do not take baths, swim, or use a hot tub until your doctor says it is okay. Ask your doctor if you can take showers. You may only be allowed to take sponge baths. General Instructions  Take over-the-counter and prescription medicines only as told by your doctor.  Do not drive or use heavy machinery while taking prescription pain medicine.  Return  to your normal diet as told by your doctor.  Do not lift anything that is heavier than 10 lb (4.5 kg).  Do not play contact sports for 1 week or until your doctor says it is okay. GET HELP IF:  You have redness, swelling, or pain at the site of your surgical cuts.  You have fluid, blood, or pus coming from your cuts.  You notice a bad smell coming from your cut area.  Your surgical cuts break open.  You have a fever. GET HELP RIGHT AWAY IF:   You have a rash.  You have trouble breathing.  You have chest pain.  You have pain in your shoulders (shoulder strap areas) that is getting worse.  You pass out (faint) or feel dizzy while you are standing.  You have very bad pain in your belly (abdomen).  You feel sick to your stomach (nauseous) or throw up (vomit) for more than 1 day.   This  information is not intended to replace advice given to you by your health care provider. Make sure you discuss any questions you have with your health care provider.   Document Released: 02/15/2008 Document Revised: 01/27/2015 Document Reviewed: 12/18/2012 Elsevier Interactive Patient Education 2016 Elsevier Inc. General Anesthesia, Adult General anesthesia is a sleep-like state of non-feeling produced by medicines (anesthetics). General anesthesia prevents you from being alert and feeling pain during a medical procedure. Your caregiver may recommend general anesthesia if your procedure:  Is long.  Is painful or uncomfortable.  Would be frightening to see or hear.  Requires you to be still.  Affects your breathing.  Causes significant blood loss. LET YOUR CAREGIVER KNOW ABOUT:  Allergies to food or medicine.  Medicines taken, including vitamins, herbs, eyedrops, over-the-counter medicines, and creams.  Use of steroids (by mouth or creams).  Previous problems with anesthetics or numbing medicines, including problems experienced by relatives.  History of bleeding problems or blood clots.  Previous surgeries and types of anesthetics received.  Possibility of pregnancy, if this applies.  Use of cigarettes, alcohol, or illegal drugs.  Any health condition(s), especially diabetes, sleep apnea, and high blood pressure. RISKS AND COMPLICATIONS General anesthesia rarely causes complications. However, if complications do occur, they can be life threatening. Complications include:  A lung infection.  A stroke.  A heart attack.  Waking up during the procedure. When this occurs, the patient may be unable to move and communicate that he or she is awake. The patient may feel severe pain. Older adults and adults with serious medical problems are more likely to have complications than adults who are young and healthy. Some complications can be prevented by answering all of your  caregiver's questions thoroughly and by following all pre-procedure instructions. It is important to tell your caregiver if any of the pre-procedure instructions, especially those related to diet, were not followed. Any food or liquid in the stomach can cause problems when you are under general anesthesia. BEFORE THE PROCEDURE  Ask your caregiver if you will have to spend the night at the hospital. If you will not have to spend the night, arrange to have an adult drive you and stay with you for 24 hours.  Follow your caregiver's instructions if you are taking dietary supplements or medicines. Your caregiver may tell you to stop taking them or to reduce your dosage.  Do not smoke for as long as possible before your procedure. If possible, stop smoking 3-6 weeks before the procedure.  Do not take  new dietary supplements or medicines within 1 week of your procedure unless your caregiver approves them.  Do not eat within 8 hours of your procedure or as directed by your caregiver. Drink only clear liquids, such as water, black coffee (without milk or cream), and fruit juices (without pulp).  Do not drink within 3 hours of your procedure or as directed by your caregiver.  You may brush your teeth on the morning of the procedure, but make sure to spit out the toothpaste and water when finished. PROCEDURE  You will receive anesthetics through a mask, through an intravenous (IV) access tube, or through both. A doctor who specializes in anesthesia (anesthesiologist) or a nurse who specializes in anesthesia (nurse anesthetist) or both will stay with you throughout the procedure to make sure you remain unconscious. He or she will also watch your blood pressure, pulse, and oxygen levels to make sure that the anesthetics do not cause any problems. Once you are asleep, a breathing tube or mask may be used to help you breathe. AFTER THE PROCEDURE You will wake up after the procedure is complete. You may be in the  room where the procedure was performed or in a recovery area. You may have a sore throat if a breathing tube was used. You may also feel:  Dizzy.  Weak.  Drowsy.  Confused.  Nauseous.  Cold. These are all normal responses and can be expected to last for up to 24 hours after the procedure is complete. A caregiver will tell you when you are ready to go home. This will usually be when you are fully awake and in stable condition.   This information is not intended to replace advice given to you by your health care provider. Make sure you discuss any questions you have with your health care provider.   Document Released: 08/15/2007 Document Revised: 05/29/2014 Document Reviewed: 09/06/2011 Elsevier Interactive Patient Education 2016 Elsevier Inc. General Anesthesia, Adult, Care After Refer to this sheet in the next few weeks. These instructions provide you with information on caring for yourself after your procedure. Your health care provider may also give you more specific instructions. Your treatment has been planned according to current medical practices, but problems sometimes occur. Call your health care provider if you have any problems or questions after your procedure. WHAT TO EXPECT AFTER THE PROCEDURE After the procedure, it is typical to experience:  Sleepiness.  Nausea and vomiting. HOME CARE INSTRUCTIONS  For the first 24 hours after general anesthesia:  Have a responsible person with you.  Do not drive a car. If you are alone, do not take public transportation.  Do not drink alcohol.  Do not take medicine that has not been prescribed by your health care provider.  Do not sign important papers or make important decisions.  You may resume a normal diet and activities as directed by your health care provider.  Change bandages (dressings) as directed.  If you have questions or problems that seem related to general anesthesia, call the hospital and ask for the  anesthetist or anesthesiologist on call. SEEK MEDICAL CARE IF:  You have nausea and vomiting that continue the day after anesthesia.  You develop a rash. SEEK IMMEDIATE MEDICAL CARE IF:   You have difficulty breathing.  You have chest pain.  You have any allergic problems.   This information is not intended to replace advice given to you by your health care provider. Make sure you discuss any questions you have with  your health care provider.   Document Released: 08/14/2000 Document Revised: 05/29/2014 Document Reviewed: 09/06/2011 Elsevier Interactive Patient Education [employer].

## 2015-12-14 ENCOUNTER — Encounter (HOSPITAL_COMMUNITY): Payer: Self-pay

## 2015-12-14 ENCOUNTER — Encounter (HOSPITAL_COMMUNITY)
Admission: RE | Admit: 2015-12-14 | Discharge: 2015-12-14 | Disposition: A | Discharge status: Home / Self Care | Payer: 59 | Source: Ambulatory Visit | Attending: Surgery | Admitting: Surgery

## 2015-12-14 ENCOUNTER — Ambulatory Visit (HOSPITAL_COMMUNITY)
Admission: RE | Admit: 2015-12-14 | Discharge: 2015-12-14 | Disposition: A | Discharge status: Home / Self Care | Payer: 59 | Source: Ambulatory Visit | Attending: Surgery | Admitting: Surgery

## 2015-12-14 DIAGNOSIS — Z01810 Encounter for preprocedural cardiovascular examination: Secondary | ICD-10-CM | POA: Diagnosis present

## 2015-12-14 DIAGNOSIS — Z01811 Encounter for preprocedural respiratory examination: Secondary | ICD-10-CM

## 2015-12-14 DIAGNOSIS — K859 Acute pancreatitis without necrosis or infection, unspecified: Secondary | ICD-10-CM | POA: Insufficient documentation

## 2015-12-14 HISTORY — DX: Attention-deficit hyperactivity disorder, unspecified type: F90.9

## 2015-12-14 HISTORY — DX: Unspecified osteoarthritis, unspecified site: M19.90

## 2015-12-14 MED ORDER — ONDANSETRON HCL 4 MG/2ML IJ SOLN: 4.0000 mg | Freq: Once | INTRAMUSCULAR | Status: DC | PRN

## 2015-12-14 MED ORDER — FENTANYL CITRATE (PF) 100 MCG/2ML IJ SOLN: 25.0000 ug | INTRAMUSCULAR | Status: DC | PRN

## 2015-12-14 NOTE — Pre-Procedure Instructions (Signed)
Patient given information to sign up for my chart at home. 

## 2015-12-15 ENCOUNTER — Ambulatory Visit (HOSPITAL_COMMUNITY): Payer: 59 | Admitting: Anesthesiology

## 2015-12-15 ENCOUNTER — Ambulatory Visit (HOSPITAL_COMMUNITY)
Admission: RE | Admit: 2015-12-15 | Discharge: 2015-12-15 | Disposition: A | Discharge status: Home / Self Care | Payer: 59 | Source: Ambulatory Visit | Attending: Surgery | Admitting: Surgery

## 2015-12-15 ENCOUNTER — Encounter (HOSPITAL_COMMUNITY)
Admission: RE | Disposition: A | Discharge status: Home / Self Care | Payer: Self-pay | Source: Ambulatory Visit | Attending: Surgery

## 2015-12-15 ENCOUNTER — Encounter (HOSPITAL_COMMUNITY): Payer: Self-pay

## 2015-12-15 DIAGNOSIS — I10 Essential (primary) hypertension: Secondary | ICD-10-CM | POA: Diagnosis not present

## 2015-12-15 DIAGNOSIS — F17210 Nicotine dependence, cigarettes, uncomplicated: Secondary | ICD-10-CM | POA: Insufficient documentation

## 2015-12-15 DIAGNOSIS — K811 Chronic cholecystitis: Secondary | ICD-10-CM | POA: Diagnosis not present

## 2015-12-15 DIAGNOSIS — K8020 Calculus of gallbladder without cholecystitis without obstruction: Secondary | ICD-10-CM | POA: Diagnosis present

## 2015-12-15 DIAGNOSIS — Z79899 Other long term (current) drug therapy: Secondary | ICD-10-CM | POA: Insufficient documentation

## 2015-12-15 HISTORY — PX: CHOLECYSTECTOMY: SHX55

## 2015-12-15 SURGERY — LAPAROSCOPIC CHOLECYSTECTOMY
Anesthesia: General

## 2015-12-15 MED ORDER — NEOSTIGMINE METHYLSULFATE 10 MG/10ML IV SOLN
INTRAVENOUS | Status: AC
  Filled 2015-12-15: qty 1

## 2015-12-15 MED ORDER — ONDANSETRON HCL 4 MG/2ML IJ SOLN
INTRAMUSCULAR | Status: AC
  Filled 2015-12-15: qty 2

## 2015-12-15 MED ORDER — PROPOFOL 10 MG/ML IV BOLUS
INTRAVENOUS | Status: DC | PRN
  Administered 2015-12-15: 180 mg via INTRAVENOUS
  Administered 2015-12-15 (×2): 20 mg via INTRAVENOUS

## 2015-12-15 MED ORDER — EPHEDRINE SULFATE 50 MG/ML IJ SOLN
INTRAMUSCULAR | Status: AC
  Filled 2015-12-15: qty 1

## 2015-12-15 MED ORDER — LACTATED RINGERS IV SOLN
INTRAVENOUS | Status: DC
  Administered 2015-12-15: 1000 mL via INTRAVENOUS

## 2015-12-15 MED ORDER — ONDANSETRON HCL 4 MG/2ML IJ SOLN
4.0000 mg | Freq: Once | INTRAMUSCULAR | Status: AC
  Administered 2015-12-15: 4 mg via INTRAVENOUS

## 2015-12-15 MED ORDER — PROPOFOL 10 MG/ML IV BOLUS
INTRAVENOUS | Status: AC
  Filled 2015-12-15: qty 40

## 2015-12-15 MED ORDER — LIDOCAINE HCL (PF) 1 % IJ SOLN
INTRAMUSCULAR | Status: AC
  Filled 2015-12-15: qty 5

## 2015-12-15 MED ORDER — GLYCOPYRROLATE 0.2 MG/ML IJ SOLN
INTRAMUSCULAR | Status: DC | PRN
  Administered 2015-12-15: 0.2 mg via INTRAVENOUS
  Administered 2015-12-15: 0.6 mg via INTRAVENOUS

## 2015-12-15 MED ORDER — NEOSTIGMINE METHYLSULFATE 10 MG/10ML IV SOLN
INTRAVENOUS | Status: DC | PRN
  Administered 2015-12-15: 4 mg via INTRAVENOUS

## 2015-12-15 MED ORDER — LIDOCAINE HCL 1 % IJ SOLN
INTRAMUSCULAR | Status: DC | PRN
  Administered 2015-12-15: 19 mL

## 2015-12-15 MED ORDER — ROCURONIUM BROMIDE 50 MG/5ML IV SOLN
INTRAVENOUS | Status: AC
  Filled 2015-12-15: qty 1

## 2015-12-15 MED ORDER — CEFAZOLIN SODIUM-DEXTROSE 2-4 GM/100ML-% IV SOLN
2.0000 g | INTRAVENOUS | Status: AC
  Administered 2015-12-15: 2 g via INTRAVENOUS
  Filled 2015-12-15: qty 100

## 2015-12-15 MED ORDER — FENTANYL CITRATE (PF) 100 MCG/2ML IJ SOLN
INTRAMUSCULAR | Status: DC | PRN
  Administered 2015-12-15: 50 ug via INTRAVENOUS
  Administered 2015-12-15 (×2): 25 ug via INTRAVENOUS
  Administered 2015-12-15 (×5): 50 ug via INTRAVENOUS

## 2015-12-15 MED ORDER — GLYCOPYRROLATE 0.2 MG/ML IJ SOLN
INTRAMUSCULAR | Status: AC
  Filled 2015-12-15: qty 3

## 2015-12-15 MED ORDER — ARTIFICIAL TEARS OP OINT: TOPICAL_OINTMENT | OPHTHALMIC | Status: DC | PRN

## 2015-12-15 MED ORDER — CHLORHEXIDINE GLUCONATE CLOTH 2 % EX PADS: 6.0000 | MEDICATED_PAD | Freq: Once | CUTANEOUS | Status: DC

## 2015-12-15 MED ORDER — FENTANYL CITRATE (PF) 250 MCG/5ML IJ SOLN
INTRAMUSCULAR | Status: AC
  Filled 2015-12-15: qty 5

## 2015-12-15 MED ORDER — FENTANYL CITRATE (PF) 100 MCG/2ML IJ SOLN
25.0000 ug | INTRAMUSCULAR | Status: DC | PRN
  Administered 2015-12-15 (×4): 50 ug via INTRAVENOUS
  Filled 2015-12-15 (×2): qty 2

## 2015-12-15 MED ORDER — SODIUM CHLORIDE 0.9 % IR SOLN
Status: DC | PRN
  Administered 2015-12-15: 1000 mL

## 2015-12-15 MED ORDER — FENTANYL CITRATE (PF) 100 MCG/2ML IJ SOLN
INTRAMUSCULAR | Status: AC
  Filled 2015-12-15: qty 2

## 2015-12-15 MED ORDER — SODIUM CHLORIDE 0.9 % IJ SOLN
INTRAMUSCULAR | Status: AC
  Filled 2015-12-15: qty 10

## 2015-12-15 MED ORDER — ROCURONIUM BROMIDE 100 MG/10ML IV SOLN
INTRAVENOUS | Status: DC | PRN
  Administered 2015-12-15: 30 mg via INTRAVENOUS
  Administered 2015-12-15: 5 mg via INTRAVENOUS
  Administered 2015-12-15: 10 mg via INTRAVENOUS
  Administered 2015-12-15: 5 mg via INTRAVENOUS

## 2015-12-15 MED ORDER — LIDOCAINE HCL (CARDIAC) 20 MG/ML IV SOLN
INTRAVENOUS | Status: DC | PRN
  Administered 2015-12-15: 25 mg via INTRAVENOUS
  Administered 2015-12-15: 50 mg via INTRAVENOUS

## 2015-12-15 MED ORDER — OXYCODONE-ACETAMINOPHEN 5-325 MG PO TABS: 1.0000 | ORAL_TABLET | ORAL | 0 refills | Status: DC | PRN

## 2015-12-15 MED ORDER — SUCCINYLCHOLINE CHLORIDE 20 MG/ML IJ SOLN
INTRAMUSCULAR | Status: AC
  Filled 2015-12-15: qty 1

## 2015-12-15 MED ORDER — LIDOCAINE HCL (PF) 1 % IJ SOLN
INTRAMUSCULAR | Status: AC
  Filled 2015-12-15: qty 30

## 2015-12-15 MED ORDER — BUPIVACAINE HCL (PF) 0.5 % IJ SOLN
INTRAMUSCULAR | Status: AC
  Filled 2015-12-15: qty 30

## 2015-12-15 MED ORDER — HEMOSTATIC AGENTS (NO CHARGE) OPTIME
TOPICAL | Status: DC | PRN
Start: 2015-12-15 — End: 2015-12-15
  Administered 2015-12-15: 1 via TOPICAL

## 2015-12-15 MED ORDER — ONDANSETRON HCL 4 MG/2ML IJ SOLN: 4.0000 mg | Freq: Once | INTRAMUSCULAR | Status: DC | PRN

## 2015-12-15 MED ORDER — MIDAZOLAM HCL 2 MG/2ML IJ SOLN
1.0000 mg | INTRAMUSCULAR | Status: DC | PRN
Start: 2015-12-15 — End: 2015-12-15
  Administered 2015-12-15: 2 mg via INTRAVENOUS

## 2015-12-15 MED ORDER — LACTATED RINGERS IV SOLN
INTRAVENOUS | Status: DC | PRN
  Administered 2015-12-15 (×2): via INTRAVENOUS

## 2015-12-15 MED ORDER — MIDAZOLAM HCL 2 MG/2ML IJ SOLN
INTRAMUSCULAR | Status: AC
  Filled 2015-12-15: qty 2

## 2015-12-15 SURGICAL SUPPLY — 42 items
APPLIER CLIP LAPSCP 10X32 DD (CLIP) ×2 IMPLANT
BAG HAMPER (MISCELLANEOUS) ×2 IMPLANT
CHLORAPREP W/TINT 26ML (MISCELLANEOUS) ×2 IMPLANT
CLOTH BEACON ORANGE TIMEOUT ST (SAFETY) ×2 IMPLANT
COVER LIGHT HANDLE STERIS (MISCELLANEOUS) ×4 IMPLANT
DECANTER SPIKE VIAL GLASS SM (MISCELLANEOUS) ×4 IMPLANT
DERMABOND ADVANCED (GAUZE/BANDAGES/DRESSINGS) ×1
DERMABOND ADVANCED .7 DNX12 (GAUZE/BANDAGES/DRESSINGS) ×1 IMPLANT
DEVICE TROCAR PUNCTURE CLOSURE (ENDOMECHANICALS) ×2 IMPLANT
ELECT REM PT RETURN 9FT ADLT (ELECTROSURGICAL) ×2
ELECTRODE REM PT RTRN 9FT ADLT (ELECTROSURGICAL) ×1 IMPLANT
FILTER SMOKE EVAC LAPAROSHD (FILTER) ×2 IMPLANT
FORMALIN 10 PREFIL 120ML (MISCELLANEOUS) ×2 IMPLANT
GLOVE BIOGEL PI IND STRL 7.0 (GLOVE) ×1 IMPLANT
GLOVE BIOGEL PI IND STRL 7.5 (GLOVE) ×2 IMPLANT
GLOVE BIOGEL PI INDICATOR 7.0 (GLOVE) ×1
GLOVE BIOGEL PI INDICATOR 7.5 (GLOVE) ×2
GLOVE ECLIPSE 7.0 STRL STRAW (GLOVE) ×2 IMPLANT
GOWN STRL REUS W/ TWL XL LVL3 (GOWN DISPOSABLE) ×1 IMPLANT
GOWN STRL REUS W/TWL LRG LVL3 (GOWN DISPOSABLE) ×4 IMPLANT
GOWN STRL REUS W/TWL XL LVL3 (GOWN DISPOSABLE) ×1
HEMOSTAT SNOW SURGICEL 2X4 (HEMOSTASIS) ×2 IMPLANT
INST SET LAPROSCOPIC AP (KITS) ×2 IMPLANT
IV NS IRRIG 3000ML ARTHROMATIC (IV SOLUTION) IMPLANT
KIT ROOM TURNOVER APOR (KITS) ×2 IMPLANT
MANIFOLD NEPTUNE II (INSTRUMENTS) ×2 IMPLANT
NEEDLE INSUFFLATION 14GA 120MM (NEEDLE) ×2 IMPLANT
NS IRRIG 1000ML POUR BTL (IV SOLUTION) ×2 IMPLANT
PACK LAP CHOLE LZT030E (CUSTOM PROCEDURE TRAY) ×2 IMPLANT
PAD ARMBOARD 7.5X6 YLW CONV (MISCELLANEOUS) ×2 IMPLANT
POUCH SPECIMEN RETRIEVAL 10MM (ENDOMECHANICALS) ×2 IMPLANT
SET BASIN LINEN APH (SET/KITS/TRAYS/PACK) ×2 IMPLANT
SET TUBE IRRIG SUCTION NO TIP (IRRIGATION / IRRIGATOR) IMPLANT
SLEEVE ENDOPATH XCEL 5M (ENDOMECHANICALS) ×4 IMPLANT
SUT VIC AB 4-0 PS2 27 (SUTURE) ×2 IMPLANT
SUT VICRYL 0 UR6 27IN ABS (SUTURE) ×2 IMPLANT
SUT VICRYL AB 3-0 FS1 BRD 27IN (SUTURE) ×2 IMPLANT
TROCAR ENDO BLADELESS 11MM (ENDOMECHANICALS) ×2 IMPLANT
TROCAR XCEL NON-BLD 5MMX100MML (ENDOMECHANICALS) ×2 IMPLANT
TUBE CONNECTING 12X1/4 (SUCTIONS) ×2 IMPLANT
TUBING INSUFFLATION (TUBING) ×2 IMPLANT
WARMER LAPAROSCOPE (MISCELLANEOUS) ×2 IMPLANT

## 2015-12-15 NOTE — Anesthesia Postprocedure Evaluation (Signed)
Anesthesia Post Note  Patient: Jeff Buck  Procedure(s) Performed: Procedure(s) (LRB): LAPAROSCOPIC CHOLECYSTECTOMY (N/A)  Patient location during evaluation: PACU Anesthesia Type: General Level of consciousness: awake and alert and oriented Pain management: pain level controlled Vital Signs Assessment: post-procedure vital signs reviewed and stable Respiratory status: spontaneous breathing Cardiovascular status: stable Postop Assessment: no signs of nausea or vomiting Anesthetic complications: no    Last Vitals:  Vitals:   12/15/15 0840 12/15/15 1030  BP: 117/82 (!) 148/87  Pulse:    Resp: 13   Temp:  36.5 C    Last Pain:  Vitals:   12/15/15 1045  TempSrc:   PainSc: 6                  ADAMS, AMY A

## 2015-12-15 NOTE — Transfer of Care (Signed)
Immediate Anesthesia Transfer of Care Note  Patient: Jeff Buck  Procedure(s) Performed: Procedure(s): LAPAROSCOPIC CHOLECYSTECTOMY (N/A)  Patient Location: PACU  Anesthesia Type:General  Level of Consciousness: awake, oriented and patient cooperative  Airway & Oxygen Therapy: Patient Spontanous Breathing and Patient connected to face mask oxygen  Post-op Assessment: Report given to RN and Post -op Vital signs reviewed and stable  Post vital signs: Reviewed and stable  Last Vitals:  Vitals:   12/15/15 0840 12/15/15 1030  BP: 117/82 (!) (P) 148/87  Pulse:    Resp: 13   Temp:  (P) 36.5 C    Last Pain:  Vitals:   12/15/15 0726  TempSrc: Oral      Patients Stated Pain Goal: 5 (12/15/15 0707)  Complications: No apparent anesthesia complications

## 2015-12-15 NOTE — Discharge Instructions (Signed)
In addition to included general post-operative instructions for Laparoscopic Cholecystectomy,  Diet: Resume home heart healthy diet.  Activity: No heavy lifting (children, pets, laundry) or strenuous activity until follow-up, but light activity and walking are encouraged. Do not drive or drink alcohol if taking narcotic pain medications.   Wound care: 2 days after surgery (Friday, 7/28), may shower/get incision wet with soapy water and pat dry (do not rub incisions), but no baths or submerging incision underwater until follow-up.   Medications: Resume all home medications. For mild to moderate pain: acetaminophen (Tylenol) or ibuprofen (if no kidney disease). Narcotic pain medications, if prescribed, can be used for severe pain, though may cause nausea, constipation, and drowsiness. Do not combine Tylenol and Percocet within a 6 hour period as Percocet contains Tylenol.  Call office [phone]) at any time if any questions, worsening pain, fevers/chills, bleeding, drainage from incision site, or other concerns.

## 2015-12-15 NOTE — Anesthesia Procedure Notes (Signed)
Procedure Name: Intubation Date/Time: 12/15/2015 9:01 AM Performed by: Pernell Dupre, Atanacio Melnyk A Pre-anesthesia Checklist: Patient identified, Patient being monitored, Timeout performed, Emergency Drugs available and Suction available Patient Re-evaluated:Patient Re-evaluated prior to inductionOxygen Delivery Method: Circle System Utilized Preoxygenation: Pre-oxygenation with 100% oxygen Intubation Type: IV induction Ventilation: Mask ventilation without difficulty Laryngoscope Size: Miller and 3 Grade View: Grade I Tube type: Oral Tube size: 8.0 mm Number of attempts: 1 Airway Equipment and Method: Stylet Placement Confirmation: ETT inserted through vocal cords under direct vision,  positive ETCO2 and breath sounds checked- equal and bilateral Secured at: 21 cm Tube secured with: Tape Dental Injury: Teeth and Oropharynx as per pre-operative assessment

## 2015-12-15 NOTE — Interval H&P Note (Signed)
History and Physical Interval Note:  12/15/2015 8:25 AM  Jeff Buck  has presented today for surgery, with the diagnosis of gallstone pancreatitis  The various methods of treatment have been discussed with the patient and family. After consideration of risks, benefits and other options for treatment, the patient has consented to  Procedure(s): LAPAROSCOPIC CHOLECYSTECTOMY (N/A) as a surgical intervention .  The patient's history has been reviewed, patient examined, no change in status, stable for surgery.  I have reviewed the patient's chart and labs.  Questions were answered to the patient's satisfaction.     [name]

## 2015-12-15 NOTE — Anesthesia Preprocedure Evaluation (Signed)
Anesthesia Evaluation  Patient identified by MRN, date of birth, ID band Patient awake    History of Anesthesia Complications (+) AWARENESS UNDER ANESTHESIA and history of anesthetic complications  Airway Mallampati: II  TM Distance: >3 FB     Dental  (+) Teeth Intact   Pulmonary Current Smoker,    breath sounds clear to auscultation       Cardiovascular hypertension,  Rhythm:Regular Rate:Normal     Neuro/Psych PSYCHIATRIC DISORDERS (ADHD) Anxiety    GI/Hepatic   Endo/Other    Renal/GU      Musculoskeletal   Abdominal   Peds  Hematology   Anesthesia Other Findings   Reproductive/Obstetrics                            Anesthesia Physical Anesthesia Plan  ASA: II  Anesthesia Plan: General   Post-op Pain Management:    Induction: Intravenous  Airway Management Planned: Oral ETT  Additional Equipment:   Intra-op Plan:   Post-operative Plan: Extubation in OR  Informed Consent: I have reviewed the patients History and Physical, chart, labs and discussed the procedure including the risks, benefits and alternatives for the proposed anesthesia with the patient or authorized representative who has indicated his/her understanding and acceptance.     Plan Discussed with:   Anesthesia Plan Comments:         Anesthesia Quick Evaluation

## 2015-12-15 NOTE — Op Note (Signed)
SURGICAL OPERATIVE REPORT   DATE OF PROCEDURE: 12/15/2015  ATTENDING Surgeon(s): [name], MD  ASSISTANT(S): Franky Macho, MD   ANESTHESIA: GETA  PRE-OPERATIVE DIAGNOSIS: Pancreatitis attributed to microcholelithiasis  POST-OPERATIVE DIAGNOSIS: Pancreatitis attributed to microcholelithiasis  PROCEDURE(S):  1.) Laparoscopic Cholecystectomy  INTRAOPERATIVE FINDINGS: Gallbladder with mild-/moderate- pericholecystic inflammation and thick bilious fluid in gallbladder   INTRAOPERATIVE FLUIDS: 1200 mL crystalloid   ESTIMATED BLOOD LOSS: Minimal (<30 mL)   URINE OUTPUT: No foley  SPECIMENS: Gallbladder  IMPLANTS: None  DRAINS: None   COMPLICATIONS: None apparent   CONDITION AT COMPLETION: Hemodynamically stable and extubated  DISPOSITION: PACU   INDICATION(S) FOR PROCEDURE:  Patient is a 40 y.o. male who this admission presented with multiple episodes of pancreatitis with unknown etiology, no significant history of alcohol consumption, and no obvious cholelithiasis on abdominal ultrasound. Endoscopic ultrasound suggested microcholelithiasis, and cholecystectomy was requested. All risks, benefits, and alternatives to above elective procedures were discussed with the patient, who elected to proceed, and informed consent was accordingly obtained at that time. Patient also expressed clear understanding that surgery (cholecystectomy) in this context may be diagnostic as much as potentially therapeutic. He also verified understanding that he may still experience pancreatitis, considering its unclear etiology, even after surgical removal of his gallbladder.  DETAILS OF PROCEDURE:  Patient was brought to the operating suite and appropriately identified. General anesthesia was administered along with peri-operative prophylactic IV antibiotics, and endotracheal intubation was performed by anesthesiologist, along with NG/OG tube for gastric decompression. In supine position,  operative site was prepped and draped in usual sterile fashion, and following a brief time out, initial 5 mm incision was made in a natural skin crease just above the umbilicus. Fascia was then elevated, and a Verress needle was inserted and its proper position confirmed using aspiration and saline meniscus test.  Upon insufflation of the abdominal cavity with carbon dioxide to a well-tolerated pressure of 12-15 mmHg, 5 mm peri-umbilical port followed by laparoscope were inserted and used to inspect the abdominal cavity and its contents with no injuries from insertion of the first trochar noted. Three additional trocars were inserted, one at the epigastric position (10 mm) and two along the Right costal margin (5 mm). The table was then placed in reverse Trendelenburg position with the Right side up. Filmy adhesions between the gallbladder and omentum/duodenum/transverse colon were lysed using combined blunt and sharp dissection. The apex/dome of the gallbladder was grasped with an atraumatic grasper passed through the lateral port and retracted apically over the liver. The infundibulum was also grasped and retracted, exposing Calot's triangle. The peritoneum overlying the gallbladder infundibulum was incised and dissected free of surrounding peritoneal attachments, revealing the cystic duct and cystic artery, which were clipped twice on the patient side and once on the gallbladder specimen side close to the gallbladder. The gallbladder was then dissected from its peritoneal attachments to the liver using electrocautery, and the gallbladder was placed into a laparoscopic specimen bag and removed from the abdominal cavity via the epigastric port site. Hemostasis and secure placement of clips were confirmed, and intra-peritoneal cavity was inspected with no additional findings. Endoclose laparoscopic fascial closure device was then used to re-approximate fascia at the 10 mm epigastric port site.  All ports were  then removed under direct visualization, and abdominal cavity was desuflated. All port sites were irrigated/cleaned, additional local anesthetic was injected at each incision, 3-0 Vicryl was used to re-approximate dermis at 10 mm port site(s), and subcuticular 4-0 Vicryl suture was used  to re-approximate skin. Skin was then cleaned, dried, and sterile skin glue was applied. Patient was then safely able to be awakened, extubated, and transferred to PACU for post-operative monitoring and care.   I was present for all aspects of procedure, and there were no intra-operative complications apparent.

## 2015-12-21 ENCOUNTER — Encounter (HOSPITAL_COMMUNITY): Payer: Self-pay | Admitting: Surgery

## 2016-04-04 ENCOUNTER — Encounter (HOSPITAL_COMMUNITY): Payer: Self-pay | Admitting: *Deleted

## 2016-04-04 ENCOUNTER — Emergency Department (HOSPITAL_COMMUNITY)
Admission: EM | Admit: 2016-04-04 | Discharge: 2016-04-04 | Disposition: A | Discharge status: Home / Self Care | Payer: 59 | Attending: Emergency Medicine | Admitting: Emergency Medicine

## 2016-04-04 DIAGNOSIS — F17210 Nicotine dependence, cigarettes, uncomplicated: Secondary | ICD-10-CM | POA: Diagnosis not present

## 2016-04-04 DIAGNOSIS — R1012 Left upper quadrant pain: Secondary | ICD-10-CM | POA: Diagnosis present

## 2016-04-04 DIAGNOSIS — F909 Attention-deficit hyperactivity disorder, unspecified type: Secondary | ICD-10-CM | POA: Insufficient documentation

## 2016-04-04 DIAGNOSIS — Z79899 Other long term (current) drug therapy: Secondary | ICD-10-CM | POA: Diagnosis not present

## 2016-04-04 DIAGNOSIS — I10 Essential (primary) hypertension: Secondary | ICD-10-CM | POA: Diagnosis not present

## 2016-04-04 DIAGNOSIS — K858 Other acute pancreatitis without necrosis or infection: Secondary | ICD-10-CM | POA: Diagnosis not present

## 2016-04-04 LAB — COMPREHENSIVE METABOLIC PANEL
ALK PHOS: 70 U/L (ref 38–126)
ALT: 29 U/L (ref 17–63)
AST: 22 U/L (ref 15–41)
Albumin: 4.1 g/dL (ref 3.5–5.0)
Anion gap: 5 (ref 5–15)
BILIRUBIN TOTAL: 0.5 mg/dL (ref 0.3–1.2)
BUN: 12 mg/dL (ref 6–20)
CALCIUM: 8.9 mg/dL (ref 8.9–10.3)
CHLORIDE: 105 mmol/L (ref 101–111)
CO2: 28 mmol/L (ref 22–32)
CREATININE: 0.94 mg/dL (ref 0.61–1.24)
Glucose, Bld: 129 mg/dL — ABNORMAL HIGH (ref 65–99)
Potassium: 3.9 mmol/L (ref 3.5–5.1)
Sodium: 138 mmol/L (ref 135–145)
TOTAL PROTEIN: 6.9 g/dL (ref 6.5–8.1)

## 2016-04-04 LAB — CBC WITH DIFFERENTIAL/PLATELET
Basophils Absolute: 0 10*3/uL (ref 0.0–0.1)
Basophils Relative: 0 %
EOS PCT: 2 %
Eosinophils Absolute: 0.2 10*3/uL (ref 0.0–0.7)
HEMATOCRIT: 40.7 % (ref 39.0–52.0)
Hemoglobin: 14.1 g/dL (ref 13.0–17.0)
LYMPHS ABS: 1.7 10*3/uL (ref 0.7–4.0)
LYMPHS PCT: 17 %
MCH: 29.4 pg (ref 26.0–34.0)
MCHC: 34.6 g/dL (ref 30.0–36.0)
MCV: 84.8 fL (ref 78.0–100.0)
Monocytes Absolute: 0.8 10*3/uL (ref 0.1–1.0)
Monocytes Relative: 8 %
Neutro Abs: 7.3 10*3/uL (ref 1.7–7.7)
Neutrophils Relative %: 73 %
PLATELETS: 314 10*3/uL (ref 150–400)
RBC: 4.8 MIL/uL (ref 4.22–5.81)
RDW: 12.6 % (ref 11.5–15.5)
WBC: 10.1 10*3/uL (ref 4.0–10.5)

## 2016-04-04 LAB — TROPONIN I

## 2016-04-04 LAB — LIPASE, BLOOD: LIPASE: 127 U/L — AB (ref 11–51)

## 2016-04-04 MED ORDER — ONDANSETRON HCL 4 MG/2ML IJ SOLN
4.0000 mg | Freq: Once | INTRAMUSCULAR | Status: AC
  Administered 2016-04-04: 4 mg via INTRAVENOUS
  Filled 2016-04-04: qty 2

## 2016-04-04 MED ORDER — OXYCODONE-ACETAMINOPHEN 5-325 MG PO TABS: 1.0000 | ORAL_TABLET | Freq: Four times a day (QID) | ORAL | 0 refills | Status: DC | PRN

## 2016-04-04 MED ORDER — MORPHINE SULFATE (PF) 4 MG/ML IV SOLN
4.0000 mg | Freq: Once | INTRAVENOUS | Status: AC
  Administered 2016-04-04: 4 mg via INTRAVENOUS
  Filled 2016-04-04: qty 1

## 2016-04-04 MED ORDER — ONDANSETRON 4 MG PO TBDP: 4.0000 mg | ORAL_TABLET | Freq: Three times a day (TID) | ORAL | 0 refills | Status: DC | PRN

## 2016-04-04 MED ORDER — GI COCKTAIL ~~LOC~~
30.0000 mL | Freq: Once | ORAL | Status: AC
  Administered 2016-04-04: 30 mL via ORAL
  Filled 2016-04-04: qty 30

## 2016-04-04 MED ORDER — SODIUM CHLORIDE 0.9 % IV BOLUS (SEPSIS)
1000.0000 mL | Freq: Once | INTRAVENOUS | Status: AC
  Administered 2016-04-04: 1000 mL via INTRAVENOUS

## 2016-04-04 NOTE — ED Provider Notes (Signed)
[facility] Provider Note   CSN: [id number] Arrival date & time: 04/04/16  0429     History   Chief Complaint Chief Complaint  Patient presents with  . Abdominal Pain    HPI Jeff Buck is a 40 y.o. male.  HPI  This is a 40 year old male with history of gallstone pancreatitis who presents with abdominal and chest pain. Patient reports onset of symptoms around 9 PM. They started after he ate dinner. He reports pain in his left upper quadrant radiating into his chest. He states that it is sharp at times. It is similar to prior pancreatitis episodes. He states he has not had any difficulty with pancreatitis since having his gallbladder removed. He states his pain is currently 8 out of 10. Nothing seems to make it better or worse. He has not taken anything for the pain. He denies any nausea or vomiting.  Past Medical History:  Diagnosis Date  . ADHD (attention deficit hyperactivity disorder)   . Anxiety   . Arthritis   . Complication of anesthesia yrs ago   woke up during knee surgery  . Hypertension   . Pancreatitis   . Sinusitis     Patient Active Problem List   Diagnosis Date Noted  . Pancreatitis, acute 08/25/2015  . RUQ pain 08/25/2015  . Palpitations 04/06/2011  . Hypertension 04/06/2011  . Heartburn 04/06/2011    Past Surgical History:  Procedure Laterality Date  . CHOLECYSTECTOMY N/A 12/15/2015   Procedure: LAPAROSCOPIC CHOLECYSTECTOMY;  Surgeon: [name], MD;  Location: [facility];  Service: General;  Laterality: N/A;  . EUS N/A 09/23/2015   Procedure: UPPER ENDOSCOPIC ULTRASOUND (EUS) LINEAR;  Surgeon: [name], MD;  Location: [facility];  Service: Endoscopy;  Laterality: N/A;  . ORTHOPEDIC SURGERY     left knee surgery       Home Medications    Prior to Admission medications   Medication Sig Start Date End Date Taking? Authorizing Provider  lisinopril (PRINIVIL,ZESTRIL) 20 MG tablet Take 20 mg by mouth daily.    Yes Historical  Provider, MD  eszopiclone (LUNESTA) 2 MG TABS tablet Take 2 mg by mouth at bedtime as needed for sleep. Take immediately before bedtime    Historical Provider, MD  ondansetron (ZOFRAN ODT) 4 MG disintegrating tablet Take 1 tablet (4 mg total) by mouth every 8 (eight) hours as needed for nausea or vomiting. 04/04/16   [name], MD  oxyCODONE-acetaminophen (PERCOCET/ROXICET) 5-325 MG tablet Take 1 tablet by mouth every 6 (six) hours as needed for severe pain. 04/04/16   [name], MD  oxyCODONE-acetaminophen (ROXICET) 5-325 MG tablet Take 1-2 tablets by mouth every 4 (four) hours as needed for severe pain. 12/15/15   [name], MD    Family History Family History  Problem Relation Age of Onset  . Heart failure Other   . Colon cancer Neg Hx     Social History Social History  Substance Use Topics  . Smoking status: Current Some Day Smoker    Packs/day: 0.25    Years: 0.50    Types: Cigarettes  . Smokeless tobacco: Former Neurosurgeon    Types: Snuff    Quit date: 04/16/2015     Comment: pt instructed no to smoke day of surgery.  . Alcohol use No     Allergies   Doxycycline and Hctz [hydrochlorothiazide]   Review of Systems Review of Systems  Constitutional: Negative for fever.  Respiratory: Negative for shortness of breath.  Cardiovascular: Positive for chest pain. Negative for leg swelling.  Gastrointestinal: Positive for abdominal pain. Negative for diarrhea, nausea and vomiting.  All other systems reviewed and are negative.    Physical Exam Updated Vital Signs BP 119/90   Pulse 71   Temp 98 F (36.7 C) (Oral)   Resp 12   Ht 5\' 11"  (1.803 m)   Wt 235 lb (106.6 kg)   SpO2 100%   BMI 32.78 kg/m   Physical Exam  Constitutional: He is oriented to person, place, and time. He appears well-developed and well-nourished.  HENT:  Head: Normocephalic and atraumatic.  Cardiovascular: Normal rate, regular rhythm and normal heart sounds.   No murmur  heard. Pulmonary/Chest: Effort normal and breath sounds normal. No respiratory distress. He has no wheezes.  Abdominal: Soft. Bowel sounds are normal. There is tenderness. There is no rebound.  Epigastric and left upper quadrant tenderness to palpation without rebound or guarding  Musculoskeletal: He exhibits no edema.  Neurological: He is alert and oriented to person, place, and time.  Skin: Skin is warm and dry.  Psychiatric: He has a normal mood and affect.  Nursing note and vitals reviewed.    ED Treatments / Results  Labs (all labs ordered are listed, but only abnormal results are displayed) Labs Reviewed  COMPREHENSIVE METABOLIC PANEL - Abnormal; Notable for the following:       Result Value   Glucose, Bld 129 (*)    All other components within normal limits  LIPASE, BLOOD - Abnormal; Notable for the following:    Lipase 127 (*)    All other components within normal limits  CBC WITH DIFFERENTIAL/PLATELET  TROPONIN I    EKG  EKG Interpretation  Date/Time:  Tuesday April 04 2016 04:57:14 EST Ventricular Rate:  62 PR Interval:    QRS Duration: 98 QT Interval:  379 QTC Calculation: 385 R Axis:   72 Text Interpretation:  Sinus rhythm Low voltage, precordial leads Baseline wander in lead(s) V2 Confirmed by Corky Blumstein  MD, Priscilla Kirstein ([id number]) on 04/04/2016 5:16:59 AM       Radiology No results found.  Procedures Procedures (including critical care time)  Medications Ordered in ED Medications  sodium chloride 0.9 % bolus 1,000 mL (0 mLs Intravenous Stopped 04/04/16 0611)  morphine 4 MG/ML injection 4 mg (4 mg Intravenous Given 04/04/16 0522)  ondansetron (ZOFRAN) injection 4 mg (4 mg Intravenous Given 04/04/16 0522)  gi cocktail (Maalox,Lidocaine,Donnatal) (30 mLs Oral Given 04/04/16 0522)  morphine 4 MG/ML injection 4 mg (4 mg Intravenous Given 04/04/16 0610)     Initial Impression / Assessment and Plan / ED Course  I have reviewed the triage vital signs and the  nursing notes.  Pertinent labs & imaging results that were available during my care of the patient were reviewed by me and considered in my medical decision making (see chart for details).  Clinical Course     Patient is with abdominal pain and chest pain. Reports this is consistent with prior pancreatitis flares. I have reviewed this chart. He did recently have a cholecystectomy. Denies alcohol use. Previous pancreatitis flares have only been noted on CT scan. Lab work has been normal. No pseudocyst on CT scan. Patient was given pain and nausea medication. Lab work only notable for lipase of 127. Given history, likely early pancreatitis. He has well-appearing and nontoxic. He feels better after pain and nausea medication. Will discharge home with pain and nausea medication. Clear fat-free diet recommended. Return for any new or  worsening symptoms.  After history, exam, and medical workup I feel the patient has been appropriately medically screened and is safe for discharge home. Pertinent diagnoses were discussed with the patient. Patient was given return precautions.   Final Clinical Impressions(s) / ED Diagnoses   Final diagnoses:  Other acute pancreatitis, unspecified complication status    New Prescriptions New Prescriptions   ONDANSETRON (ZOFRAN ODT) 4 MG DISINTEGRATING TABLET    Take 1 tablet (4 mg total) by mouth every 8 (eight) hours as needed for nausea or vomiting.   OXYCODONE-ACETAMINOPHEN (PERCOCET/ROXICET) 5-325 MG TABLET    Take 1 tablet by mouth every 6 (six) hours as needed for severe pain.     Shon Batonourtney F Desha Bitner, MD 04/04/16 [phone]

## 2016-04-04 NOTE — Discharge Instructions (Signed)
You were seen today. You likely have an early pancreatitis flare. Need to stick to a clear low-fat diet. Pain and nausea medication will be provided. If your symptoms worsen or you're not able to tolerate fluids to need to be reevaluated.

## 2016-04-04 NOTE — ED Triage Notes (Signed)
Pt states he is having upper abdominal pain radiating up into his chest.

## 2016-04-04 NOTE — ED Notes (Signed)
Pt tolerated water with no complaints

## 2016-05-05 ENCOUNTER — Encounter (HOSPITAL_COMMUNITY): Payer: Self-pay | Admitting: Emergency Medicine

## 2016-05-05 ENCOUNTER — Emergency Department (HOSPITAL_COMMUNITY)
Admission: EM | Admit: 2016-05-05 | Discharge: 2016-05-05 | Disposition: A | Discharge status: Home / Self Care | Payer: 59 | Attending: Emergency Medicine | Admitting: Emergency Medicine

## 2016-05-05 ENCOUNTER — Emergency Department (HOSPITAL_COMMUNITY): Payer: 59

## 2016-05-05 DIAGNOSIS — R1012 Left upper quadrant pain: Secondary | ICD-10-CM | POA: Insufficient documentation

## 2016-05-05 DIAGNOSIS — F17210 Nicotine dependence, cigarettes, uncomplicated: Secondary | ICD-10-CM | POA: Insufficient documentation

## 2016-05-05 DIAGNOSIS — I10 Essential (primary) hypertension: Secondary | ICD-10-CM | POA: Insufficient documentation

## 2016-05-05 DIAGNOSIS — Z79899 Other long term (current) drug therapy: Secondary | ICD-10-CM | POA: Insufficient documentation

## 2016-05-05 DIAGNOSIS — F909 Attention-deficit hyperactivity disorder, unspecified type: Secondary | ICD-10-CM | POA: Diagnosis not present

## 2016-05-05 LAB — CBC
HCT: 40 % (ref 39.0–52.0)
HEMOGLOBIN: 13.7 g/dL (ref 13.0–17.0)
MCH: 28.9 pg (ref 26.0–34.0)
MCHC: 34.3 g/dL (ref 30.0–36.0)
MCV: 84.4 fL (ref 78.0–100.0)
Platelets: 290 10*3/uL (ref 150–400)
RBC: 4.74 MIL/uL (ref 4.22–5.81)
RDW: 12.6 % (ref 11.5–15.5)
WBC: 8.4 10*3/uL (ref 4.0–10.5)

## 2016-05-05 LAB — COMPREHENSIVE METABOLIC PANEL
ALK PHOS: 68 U/L (ref 38–126)
ALT: 33 U/L (ref 17–63)
ANION GAP: 7 (ref 5–15)
AST: 27 U/L (ref 15–41)
Albumin: 4.3 g/dL (ref 3.5–5.0)
BILIRUBIN TOTAL: 1.1 mg/dL (ref 0.3–1.2)
BUN: 11 mg/dL (ref 6–20)
CALCIUM: 8.7 mg/dL — AB (ref 8.9–10.3)
CO2: 27 mmol/L (ref 22–32)
Chloride: 103 mmol/L (ref 101–111)
Creatinine, Ser: 1.02 mg/dL (ref 0.61–1.24)
GFR calc non Af Amer: >60 mL/min (ref >60)
Glucose, Bld: 115 mg/dL — ABNORMAL HIGH (ref 65–99)
Potassium: 3.9 mmol/L (ref 3.5–5.1)
SODIUM: 137 mmol/L (ref 135–145)
TOTAL PROTEIN: 7.1 g/dL (ref 6.5–8.1)

## 2016-05-05 LAB — URINALYSIS, ROUTINE W REFLEX MICROSCOPIC
Bilirubin Urine: NEGATIVE
Glucose, UA: NEGATIVE mg/dL
Hgb urine dipstick: NEGATIVE
Ketones, ur: NEGATIVE mg/dL
Leukocytes, UA: NEGATIVE
NITRITE: NEGATIVE
Protein, ur: NEGATIVE mg/dL
SPECIFIC GRAVITY, URINE: 1.009 (ref 1.005–1.030)
pH: 7 (ref 5.0–8.0)

## 2016-05-05 LAB — LIPASE, BLOOD: Lipase: 37 U/L (ref 11–51)

## 2016-05-05 MED ORDER — FAMOTIDINE IN NACL 20-0.9 MG/50ML-% IV SOLN
20.0000 mg | Freq: Once | INTRAVENOUS | Status: AC
Start: 2016-05-05 — End: 2016-05-05
  Administered 2016-05-05: 20 mg via INTRAVENOUS
  Filled 2016-05-05: qty 50

## 2016-05-05 MED ORDER — ONDANSETRON HCL 4 MG PO TABS: 4.0000 mg | ORAL_TABLET | Freq: Three times a day (TID) | ORAL | 0 refills | Status: DC | PRN

## 2016-05-05 MED ORDER — MORPHINE SULFATE (PF) 4 MG/ML IV SOLN
4.0000 mg | INTRAVENOUS | Status: DC | PRN
Start: 2016-05-05 — End: 2016-05-06
  Administered 2016-05-05: 4 mg via INTRAVENOUS
  Filled 2016-05-05: qty 1

## 2016-05-05 MED ORDER — FAMOTIDINE 20 MG PO TABS: 20.0000 mg | ORAL_TABLET | Freq: Two times a day (BID) | ORAL | 0 refills | Status: DC

## 2016-05-05 MED ORDER — IOPAMIDOL (ISOVUE-300) INJECTION 61%
100.0000 mL | Freq: Once | INTRAVENOUS | Status: AC | PRN
  Administered 2016-05-05: 100 mL via INTRAVENOUS

## 2016-05-05 MED ORDER — ONDANSETRON HCL 4 MG/2ML IJ SOLN
4.0000 mg | INTRAMUSCULAR | Status: DC | PRN
  Administered 2016-05-05: 4 mg via INTRAVENOUS
  Filled 2016-05-05: qty 2

## 2016-05-05 NOTE — ED Provider Notes (Signed)
[facility]ER[facility]PT Provider Note   CSN: [id number] Arrival date & time: 05/05/16  1657     History   Chief Complaint Chief Complaint  Patient presents with  . Abdominal Pain    HPI Jeff Buck is a 40 y.o. male.  HPI  Pt was seen at 1900.  Per pt, c/o gradual onset and persistence of constant LUQ abd "pain" since earlier today. Describes the abd pain as "sharp" and similar to previous episodes of pancreatitis. States the pain began after he [facility]. States he has hx of normal EGD and "they took out my gallbladder to see if that was the cause" of his recurrent abd pain.  Denies N/V, no diarrhea, no fevers, no back pain, no rash, no CP/SOB, no black or blood in stools.      Past Medical History:  Diagnosis Date  . ADHD (attention deficit hyperactivity disorder)   . Anxiety   . Arthritis   . Complication of anesthesia yrs ago   woke up during knee surgery  . Hypertension   . Pancreatitis   . Sinusitis     Patient Active Problem List   Diagnosis Date Noted  . Pancreatitis, acute 08/25/2015  . RUQ pain 08/25/2015  . Palpitations 04/06/2011  . Hypertension 04/06/2011  . Heartburn 04/06/2011    Past Surgical History:  Procedure Laterality Date  . CHOLECYSTECTOMY N/A 12/15/2015   Procedure: LAPAROSCOPIC CHOLECYSTECTOMY;  Surgeon: Ancil LinseyJason Evan Davis, MD;  Location: [facility];  Service: General;  Laterality: N/A;  . EUS N/A 09/23/2015   Procedure: UPPER ENDOSCOPIC ULTRASOUND (EUS) LINEAR;  Surgeon: Rachael Feeaniel P Jacobs, MD;  Location: [facility];  Service: Endoscopy;  Laterality: N/A;  . ORTHOPEDIC SURGERY     left knee surgery       Home Medications    Prior to Admission medications   Medication Sig Start Date End Date Taking? Authorizing Provider  ALPRAZolam Prudy Feeler(XANAX) 0.5 MG tablet Take 0.5 mg by mouth 2 (two) times daily as needed. 04/17/16  Yes Historical Provider, MD  amphetamine-dextroamphetamine (ADDERALL) 15 MG tablet Take 15 mg by mouth daily. 04/17/16  04/17/17 Yes Historical Provider, MD  B Complex Vitamins (B COMPLEX-B12 PO) Take 1 tablet by mouth daily.   Yes Historical Provider, MD  Ginkgo Biloba (GNP GINGKO BILOBA EXTRACT PO) Take 1 capsule by mouth daily.   Yes Historical Provider, MD  lisinopril (PRINIVIL,ZESTRIL) 20 MG tablet Take 20 mg by mouth daily.    Yes Historical Provider, MD  Omega-3 Fatty Acids (FISH OIL OMEGA-3 PO) Take 1 capsule by mouth daily.   Yes Historical Provider, MD  oxyCODONE-acetaminophen (PERCOCET/ROXICET) 5-325 MG tablet Take 1 tablet by mouth every 6 (six) hours as needed for severe pain. 04/04/16  Yes Shon Batonourtney F Horton, MD  PANAX GINSENG PO Take 1 capsule by mouth daily.   Yes Historical Provider, MD  promethazine (PHENERGAN) 25 MG tablet Take 25 mg by mouth every 6 (six) hours as needed for nausea or vomiting.   Yes Historical Provider, MD  ondansetron (ZOFRAN ODT) 4 MG disintegrating tablet Take 1 tablet (4 mg total) by mouth every 8 (eight) hours as needed for nausea or vomiting. Patient not taking: Reported on 05/05/2016 04/04/16   Shon Batonourtney F Horton, MD    Family History Family History  Problem Relation Age of Onset  . Heart failure Other   . Colon cancer Neg Hx     Social History Social History  Substance Use Topics  . Smoking status: Current Some Day Smoker  Packs/day: 0.25    Years: 0.50    Types: Cigarettes  . Smokeless tobacco: Former Neurosurgeon    Types: Snuff    Quit date: 04/16/2015     Comment: pt instructed no to smoke day of surgery.  . Alcohol use No     Allergies   Doxycycline and Hctz [hydrochlorothiazide]   Review of Systems Review of Systems ROS: Statement: All systems negative except as marked or noted in the HPI; Constitutional: Negative for fever and chills. ; ; Eyes: Negative for eye pain, redness and discharge. ; ; ENMT: Negative for ear pain, hoarseness, nasal congestion, sinus pressure and sore throat. ; ; Cardiovascular: Negative for chest pain, palpitations,  diaphoresis, dyspnea and peripheral edema. ; ; Respiratory: Negative for cough, wheezing and stridor. ; ; Gastrointestinal: +abd pain. Negative for nausea, vomiting, diarrhea, blood in stool, hematemesis, jaundice and rectal bleeding. . ; ; Genitourinary: Negative for dysuria, flank pain and hematuria. ; ; Musculoskeletal: Negative for back pain and neck pain. Negative for swelling and trauma.; ; Skin: Negative for pruritus, rash, abrasions, blisters, bruising and skin lesion.; ; Neuro: Negative for headache, lightheadedness and neck stiffness. Negative for weakness, altered level of consciousness, altered mental status, extremity weakness, paresthesias, involuntary movement, seizure and syncope.       Physical Exam Updated Vital Signs BP 115/71   Pulse 63   Temp 97.8 F (36.6 C) (Oral)   Resp 16   Ht 5\' 11"  (1.803 m)   Wt 230 lb (104.3 kg)   SpO2 98%   BMI 32.08 kg/m   Physical Exam 1905: Physical examination:  Nursing notes reviewed; Vital signs and O2 SAT reviewed;  Constitutional: Well developed, Well nourished, Well hydrated, In no acute distress; Head:  Normocephalic, atraumatic; Eyes: EOMI, PERRL, No scleral icterus; ENMT: Mouth and pharynx normal, Mucous membranes moist; Neck: Supple, Full range of motion, No lymphadenopathy; Cardiovascular: Regular rate and rhythm, No murmur, rub, or gallop; Respiratory: Breath sounds clear & equal bilaterally, No rales, rhonchi, wheezes.  Speaking full sentences with ease, Normal respiratory effort/excursion; Chest: Nontender, Movement normal; Abdomen: Soft, +LUQ tenderness to palp. No rebound or guarding. Nondistended, Normal bowel sounds; Genitourinary: No CVA tenderness; Extremities: Pulses normal, No tenderness, No edema, No calf edema or asymmetry.; Neuro: AA&Ox3, Major CN grossly intact.  Speech clear. No gross focal motor or sensory deficits in extremities.; Skin: Color normal, Warm, Dry.   ED Treatments / Results  Labs (all labs ordered are  listed, but only abnormal results are displayed)   EKG  EKG Interpretation None       Radiology No results found.  Procedures Procedures (including critical care time)  Medications Ordered in ED Medications  famotidine (PEPCID) IVPB 20 mg premix (20 mg Intravenous New Bag/Given 05/05/16 1932)  morphine 4 MG/ML injection 4 mg (4 mg Intravenous Given 05/05/16 1929)  ondansetron (ZOFRAN) injection 4 mg (4 mg Intravenous Given 05/05/16 1929)     Initial Impression / Assessment and Plan / ED Course  I have reviewed the triage vital signs and the nursing notes.  Pertinent labs & imaging results that were available during my care of the patient were reviewed by me and considered in my medical decision making (see chart for details).  MDM Reviewed: previous chart, nursing note and vitals Reviewed previous: labs Interpretation: labs and CT scan   Results for orders placed or performed during the hospital encounter of 05/05/16  Lipase, blood  Result Value Ref Range   Lipase 37 11 - 51  U/L  Comprehensive metabolic panel  Result Value Ref Range   Sodium 137 135 - 145 mmol/L   Potassium 3.9 3.5 - 5.1 mmol/L   Chloride 103 101 - 111 mmol/L   CO2 27 22 - 32 mmol/L   Glucose, Bld 115 (H) 65 - 99 mg/dL   BUN 11 6 - 20 mg/dL   Creatinine, Ser 1.61 0.61 - 1.24 mg/dL   Calcium 8.7 (L) 8.9 - 10.3 mg/dL   Total Protein 7.1 6.5 - 8.1 g/dL   Albumin 4.3 3.5 - 5.0 g/dL   AST 27 15 - 41 U/L   ALT 33 17 - 63 U/L   Alkaline Phosphatase 68 38 - 126 U/L   Total Bilirubin 1.1 0.3 - 1.2 mg/dL   GFR calc non Af Amer >60 >60 mL/min   GFR calc Af Amer >60 >60 mL/min   Anion gap 7 5 - 15  CBC  Result Value Ref Range   WBC 8.4 4.0 - 10.5 K/uL   RBC 4.74 4.22 - 5.81 MIL/uL   Hemoglobin 13.7 13.0 - 17.0 g/dL   HCT 09.6 04.5 - 40.9 %   MCV 84.4 78.0 - 100.0 fL   MCH 28.9 26.0 - 34.0 pg   MCHC 34.3 30.0 - 36.0 g/dL   RDW 81.1 91.4 - 78.2 %   Platelets 290 150 - 400 K/uL  Urinalysis,  Routine w reflex microscopic  Result Value Ref Range   Color, Urine COLORLESS (A) YELLOW   APPearance CLEAR CLEAR   Specific Gravity, Urine 1.009 1.005 - 1.030   pH 7.0 5.0 - 8.0   Glucose, UA NEGATIVE NEGATIVE mg/dL   Hgb urine dipstick NEGATIVE NEGATIVE   Bilirubin Urine NEGATIVE NEGATIVE   Ketones, ur NEGATIVE NEGATIVE mg/dL   Protein, ur NEGATIVE NEGATIVE mg/dL   Nitrite NEGATIVE NEGATIVE   Leukocytes, UA NEGATIVE NEGATIVE   Ct Abdomen Pelvis W Contrast Result Date: 05/05/2016 CLINICAL DATA:  Epigastric abdominal pain for 1 day. History of pancreatitis. The patient reports that the pain is similar to previous pancreatitis. EXAM: CT ABDOMEN AND PELVIS WITH CONTRAST TECHNIQUE: Multidetector CT imaging of the abdomen and pelvis was performed using the standard protocol following bolus administration of intravenous contrast. CONTRAST:  ISOVUE-300 IOPAMIDOL (ISOVUE-300) INJECTION 61% COMPARISON:  Abdomen ultrasound dated 08/27/2015 and abdomen and pelvis CT dated 08/19/2015. FINDINGS: Lower chest: Mild bilateral dependent atelectasis or scarring without significant change. Hepatobiliary: Cholecystectomy clips.  Normal appearing liver. Pancreas: Unremarkable. No pancreatic ductal dilatation or surrounding inflammatory changes. Spleen: Normal in size without focal abnormality. Tiny accessory splenule. Adrenals/Urinary Tract: Adrenal glands are unremarkable. Kidneys are normal, without renal calculi, focal lesion, or hydronephrosis. Bladder is unremarkable. Stomach/Bowel: Small hiatal hernia with diffuse wall thickening, without significant change. Appendix appears normal. No evidence of bowel wall thickening, distention, or inflammatory changes. Vascular/Lymphatic: Minimal right common iliac artery calcification. No enlarged lymph nodes. Reproductive: Prostate is unremarkable. Other: Small supraumbilical hernia containing fat. Musculoskeletal: Mild lumbar and lower thoracic spine degenerative  changes. IMPRESSION: 1. No acute abnormality. 2. Stable small sliding hiatal hernia with diffuse wall thickening. The wall thickening could be due to chronic inflammation. Electronically Signed   By: Beckie Salts M.D.   On: 05/05/2016 20:39    2055:  Workup reassuring. Tx symptomatically, f/u GI MD. Dx and testing d/w pt and family.  Questions answered.  Verb understanding, agreeable to d/c home with outpt f/u.   Final Clinical Impressions(s) / ED Diagnoses   Final diagnoses:  None  New Prescriptions New Prescriptions   No medications on file     Samuel JesterKathleen Akili Cuda, DO 05/08/16 0013

## 2016-05-05 NOTE — ED Triage Notes (Signed)
Stated with abdominal pain this am to LUQ, rates pain 5/10.  Pain is intermittent.  History of abdominal pain and pancreatitis.  Here about one month ago for same problem.

## 2016-05-05 NOTE — Discharge Instructions (Signed)
Eat a bland diet, avoiding greasy, fatty, fried foods, as well as spicy and acidic foods or beverages.  Avoid eating within the hour or 2 before going to bed or laying down.  Also avoid teas, colas, coffee, chocolate, pepermint and spearment. Take over the counter maalox/mylanta, as directed on packaging, as needed for discomfort.  Take the prescriptions as directed.  Call your regular medical doctor and the GI doctor on Monday to schedule a follow up appointment next week.  Return to the Emergency Department immediately if worsening.

## 2016-05-05 NOTE — ED Notes (Signed)
Dr Mcmanus in prior to RN, see edp assessment for further,  

## 2016-05-24 ENCOUNTER — Emergency Department (HOSPITAL_COMMUNITY): Payer: 59

## 2016-05-24 ENCOUNTER — Encounter (HOSPITAL_COMMUNITY): Payer: Self-pay | Admitting: Emergency Medicine

## 2016-05-24 DIAGNOSIS — R1013 Epigastric pain: Secondary | ICD-10-CM | POA: Diagnosis not present

## 2016-05-24 DIAGNOSIS — F17210 Nicotine dependence, cigarettes, uncomplicated: Secondary | ICD-10-CM | POA: Insufficient documentation

## 2016-05-24 DIAGNOSIS — R109 Unspecified abdominal pain: Secondary | ICD-10-CM | POA: Diagnosis present

## 2016-05-24 DIAGNOSIS — F909 Attention-deficit hyperactivity disorder, unspecified type: Secondary | ICD-10-CM | POA: Diagnosis not present

## 2016-05-24 DIAGNOSIS — I10 Essential (primary) hypertension: Secondary | ICD-10-CM | POA: Insufficient documentation

## 2016-05-24 LAB — URINALYSIS, ROUTINE W REFLEX MICROSCOPIC
Bilirubin Urine: NEGATIVE
Glucose, UA: NEGATIVE mg/dL
Hgb urine dipstick: NEGATIVE
Ketones, ur: NEGATIVE mg/dL
LEUKOCYTES UA: NEGATIVE
Nitrite: NEGATIVE
Protein, ur: NEGATIVE mg/dL
SPECIFIC GRAVITY, URINE: 1.019 (ref 1.005–1.030)
pH: 8 (ref 5.0–8.0)

## 2016-05-24 LAB — I-STAT TROPONIN, ED: TROPONIN I, POC: 0 ng/mL (ref 0.00–0.08)

## 2016-05-24 LAB — CBC
HCT: 41.4 % (ref 39.0–52.0)
HEMOGLOBIN: 14.5 g/dL (ref 13.0–17.0)
MCH: 29 pg (ref 26.0–34.0)
MCHC: 35 g/dL (ref 30.0–36.0)
MCV: 82.8 fL (ref 78.0–100.0)
Platelets: 362 10*3/uL (ref 150–400)
RBC: 5 MIL/uL (ref 4.22–5.81)
RDW: 12.6 % (ref 11.5–15.5)
WBC: 8.2 10*3/uL (ref 4.0–10.5)

## 2016-05-24 LAB — COMPREHENSIVE METABOLIC PANEL
ALK PHOS: 70 U/L (ref 38–126)
ALT: 33 U/L (ref 17–63)
ANION GAP: 7 (ref 5–15)
AST: 26 U/L (ref 15–41)
Albumin: 4.1 g/dL (ref 3.5–5.0)
BILIRUBIN TOTAL: 0.8 mg/dL (ref 0.3–1.2)
BUN: 12 mg/dL (ref 6–20)
CALCIUM: 9.2 mg/dL (ref 8.9–10.3)
CO2: 27 mmol/L (ref 22–32)
Chloride: 105 mmol/L (ref 101–111)
Creatinine, Ser: 0.99 mg/dL (ref 0.61–1.24)
GFR calc non Af Amer: >60 mL/min (ref >60)
Glucose, Bld: 143 mg/dL — ABNORMAL HIGH (ref 65–99)
Potassium: 4 mmol/L (ref 3.5–5.1)
SODIUM: 139 mmol/L (ref 135–145)
TOTAL PROTEIN: 6.7 g/dL (ref 6.5–8.1)

## 2016-05-24 LAB — LIPASE, BLOOD: Lipase: 32 U/L (ref 11–51)

## 2016-05-24 NOTE — ED Triage Notes (Signed)
Pt states "ive had pancreatitis several times, I had my gallbladder removed in July, and I started eating supper tonight, my stomach, chest, back hurts, numbness in both arms."

## 2016-05-25 ENCOUNTER — Emergency Department (HOSPITAL_COMMUNITY)
Admission: EM | Admit: 2016-05-25 | Discharge: 2016-05-25 | Disposition: A | Discharge status: Home / Self Care | Payer: 59 | Attending: Emergency Medicine | Admitting: Emergency Medicine

## 2016-05-25 DIAGNOSIS — R1013 Epigastric pain: Secondary | ICD-10-CM

## 2016-05-25 MED ORDER — GI COCKTAIL ~~LOC~~
30.0000 mL | Freq: Once | ORAL | Status: AC
  Administered 2016-05-25: 30 mL via ORAL
  Filled 2016-05-25: qty 30

## 2016-05-25 MED ORDER — HYDROMORPHONE HCL 2 MG/ML IJ SOLN
2.0000 mg | Freq: Once | INTRAMUSCULAR | Status: AC
  Administered 2016-05-25: 2 mg via INTRAMUSCULAR
  Filled 2016-05-25: qty 1

## 2016-05-25 NOTE — Discharge Instructions (Signed)
See your PCP in 1 week. See Surgery only if you need a 2nd opinion. For now - this pain is related to pancreatitis until proven otherwise.

## 2016-05-25 NOTE — ED Provider Notes (Signed)
[facility]EMER[facility] DEPT Provider Note   CSN: [id number] Arrival date & time: 05/24/16  2044   By signing my name below, I, Jeff Buck, attest that this documentation has been prepared under the direction and in the presence of Jeff Kaplan, MD  Electronically Signed: Clovis Buck, ED Scribe. 05/25/16. 1:21 AM.   History   Chief Complaint Chief Complaint  Patient presents with  . Chest Pain  . Abdominal Pain   The history is provided by the patient. No language interpreter was used.   HPI Comments:  Jeff Buck is a 41 y.o. male, with a PMHx of HTN, pancreatitis, hernia and PSHx of cholecystectomy on 12/15/15, who presents to the Emergency Department complaining of sudden onset, diffuse, stabbing and burning abdominal pain which began in the PM yesterday. Pt states his pain suddenly began while eating dinner. He notes the pain radiates upwards to his chest and is worse upon palpation. He also reports SOB (not new) which is worse upon exertion. Pt states his pain today is not consistent with the pain he has experienced with pancreatitis. Pt states he watches what he eats and avoids fatty foods. He has taken nausea medication and 1 oxycodone with temporary relief. Pt denies active hx of cancer, recent long distance travel, hx of alcohol abuse, hx of substance abuse, hx of DM, bloody stool, family hx of IBS, family hx of Crohn's, any other associated symptoms and any other modifying factors at this time. Pt was last seen on 05/05/16 at [facility] for abdominal pain, was treated symptomatically and discharged. He has been seen by a gastroenterologist and had an endoscopy on 10/13/15. Pt does not drink alcohol but notes he smokes about 6 cigarettes a day.   PCP: Jeff Buck  Past Medical History:  Diagnosis Date  . ADHD (attention deficit hyperactivity disorder)   . Anxiety   . Arthritis   . Complication of anesthesia yrs ago   woke up during knee surgery  . Hypertension   .  Pancreatitis   . Sinusitis     Patient Active Problem List   Diagnosis Date Noted  . Pancreatitis, acute 08/25/2015  . RUQ pain 08/25/2015  . Palpitations 04/06/2011  . Hypertension 04/06/2011  . Heartburn 04/06/2011    Past Surgical History:  Procedure Laterality Date  . CHOLECYSTECTOMY N/A 12/15/2015   Procedure: LAPAROSCOPIC CHOLECYSTECTOMY;  Surgeon: Jeff Linsey, MD;  Location: [facility];  Service: General;  Laterality: N/A;  . EUS N/A 09/23/2015   Procedure: UPPER ENDOSCOPIC ULTRASOUND (EUS) LINEAR;  Surgeon: Jeff Fee, MD;  Location: [facility];  Service: Endoscopy;  Laterality: N/A;  . ORTHOPEDIC SURGERY     left knee surgery     Home Medications    Prior to Admission medications   Medication Sig Start Date End Date Taking? Authorizing Provider  ALPRAZolam Prudy Feeler) 0.5 MG tablet Take 0.5 mg by mouth 2 (two) times daily as needed. 04/17/16   Historical Provider, MD  amphetamine-dextroamphetamine (ADDERALL) 15 MG tablet Take 15 mg by mouth daily. 04/17/16 04/17/17  Historical Provider, MD  B Complex Vitamins (B COMPLEX-B12 PO) Take 1 tablet by mouth daily.    Historical Provider, MD  famotidine (PEPCID) 20 MG tablet Take 1 tablet (20 mg total) by mouth 2 (two) times daily. 05/05/16   Jeff Jester, DO  Ginkgo Biloba (GNP GINGKO BILOBA EXTRACT PO) Take 1 capsule by mouth daily.    Historical Provider, MD  lisinopril (PRINIVIL,ZESTRIL) 20 MG tablet Take 20 mg by mouth daily.  Historical Provider, MD  Omega-3 Fatty Acids (FISH OIL OMEGA-3 PO) Take 1 capsule by mouth daily.    Historical Provider, MD  ondansetron (ZOFRAN ODT) 4 MG disintegrating tablet Take 1 tablet (4 mg total) by mouth every 8 (eight) hours as needed for nausea or vomiting. Patient not taking: Reported on 05/05/2016 04/04/16   Jeff Baton, MD  ondansetron (ZOFRAN) 4 MG tablet Take 1 tablet (4 mg total) by mouth every 8 (eight) hours as needed for nausea or vomiting. 05/05/16   Jeff Jester, DO  oxyCODONE-acetaminophen (PERCOCET/ROXICET) 5-325 MG tablet Take 1 tablet by mouth every 6 (six) hours as needed for severe pain. 04/04/16   Jeff Baton, MD  PANAX GINSENG PO Take 1 capsule by mouth daily.    Historical Provider, MD  promethazine (PHENERGAN) 25 MG tablet Take 25 mg by mouth every 6 (six) hours as needed for nausea or vomiting.    Historical Provider, MD    Family History Family History  Problem Relation Age of Onset  . Heart failure Other   . Colon cancer Neg Hx     Social History Social History  Substance Use Topics  . Smoking status: Current Some Day Smoker    Packs/day: 0.25    Years: 0.50    Types: Cigarettes  . Smokeless tobacco: Former Neurosurgeon    Types: Snuff    Quit date: 04/16/2015     Comment: pt instructed no to smoke day of surgery.  . Alcohol use No     Allergies   Doxycycline and Hctz [hydrochlorothiazide]   Review of Systems Review of Systems  Constitutional: Negative for fever.  Respiratory: Positive for shortness of breath.   Gastrointestinal: Positive for abdominal pain. Negative for blood in stool.  Musculoskeletal: Positive for myalgias.  All other systems reviewed and are negative.  Physical Exam Updated Vital Signs BP 127/83   Pulse 61   Temp 98.3 F (36.8 C) (Oral)   Resp 11   SpO2 93%   Physical Exam  Constitutional: He is oriented to person, place, and time. He appears well-developed and well-nourished.  HENT:  Head: Normocephalic and atraumatic.  Eyes: EOM are normal.  Neck: Normal range of motion.  Cardiovascular: Normal rate, regular rhythm, normal heart sounds and intact distal pulses.   Pulmonary/Chest: Effort normal and breath sounds normal. No respiratory distress.  Abdominal: Soft. He exhibits no distension. There is tenderness. There is guarding. There is no rebound.  Pt has tenderness over the epigastrium with involuntary guarding. No rebound  Musculoskeletal: Normal range of motion.    Neurological: He is alert and oriented to person, place, and time.  Skin: Skin is warm and dry.  Psychiatric: He has a normal mood and affect. Judgment normal.  Nursing note and vitals reviewed.   ED Treatments / Results  DIAGNOSTIC STUDIES:  Oxygen Saturation is 97% on RA, normal by my interpretation.    COORDINATION OF CARE:  1:18 AM Discussed treatment plan with pt at bedside and pt agreed to plan.  Labs (all labs ordered are listed, but only abnormal results are displayed) Labs Reviewed  COMPREHENSIVE METABOLIC PANEL - Abnormal; Notable for the following:       Result Value   Glucose, Bld 143 (*)    All other components within normal limits  URINALYSIS, ROUTINE W REFLEX MICROSCOPIC - Abnormal; Notable for the following:    APPearance HAZY (*)    All other components within normal limits  LIPASE, BLOOD  CBC  I-STAT  TROPOININ, ED  PREPARE FRESH FROZEN PLASMA    EKG  EKG Interpretation  Date/Time:  Wednesday May 24 2016 20:54:24 EST Ventricular Rate:  73 PR Interval:  174 QRS Duration: 92 QT Interval:  358 QTC Calculation: 394 R Axis:   12 Text Interpretation:  Normal sinus rhythm Cannot rule out Anterior infarct , age undetermined Abnormal ECG No acute changes No significant change since last tracing except more pronounced q wave in lead III Confirmed by Rhunette CroftNANAVATI, MD, Janey GentaANKIT [phone] on 05/25/2016 12:58:49 AM       Radiology Dg Chest 2 View  Result Date: 05/24/2016 CLINICAL DATA:  Chest pain x1 day. EXAM: CHEST  2 VIEW COMPARISON:  12/14/2015 FINDINGS: The heart size and mediastinal contours are within normal limits. Both lungs are clear. The visualized skeletal structures are unremarkable. Cholecystectomy clips in the right upper quadrant of the abdomen. IMPRESSION: No active cardiopulmonary disease. Electronically Signed   By: Tollie Ethavid  Kwon M.D.   On: 05/24/2016 21:25    Procedures Procedures (including critical care time)  Medications Ordered in  ED Medications  HYDROmorphone (DILAUDID) injection 2 mg (2 mg Intramuscular Given 05/25/16 0159)  gi cocktail (Maalox,Lidocaine,Donnatal) (30 mLs Oral Given 05/25/16 0155)     Initial Impression / Assessment and Plan / ED Course  I have reviewed the triage vital signs and the nursing notes.  Pertinent labs & imaging results that were available during my care of the patient were reviewed by me and considered in my medical decision making (see chart for details).  Clinical Course as of May 25 428  Morey Hummingbirdhu May 25, 2016  [id number] Pain has resolved. PCP f/u requested.  Pt has a ventral hernia - there was some hernia wall thickening in the last CT. We have advised pt to see PCP first, and if the pain continues to come about, it might be worth while to get a 2nd opinion from Surgeon. He was explained that we still think the pain is from pancreatitis most likely.  [AN]    Clinical Course User Index [AN] Jeff KaplanAnkit Orbie Grupe, MD    DDx includes: Pancreatitis Hepatobiliary pathology including cholecystitis Gastritis/PUD Hernia pain  Pt comes in with cc of abd pain. Hx of pancreatitis and s/p cholecystectomy. We suspect based on the exam that pain is pancreatitis related. NEG EGD in the recent past. Pt however also has a hernia per last CT, and it did have some wall thickening - so although pre-test probability of this pain being hernia related is low, with the patient describing as pain rising up towards the chest - it is possibly the cause.    Final Clinical Impressions(s) / ED Diagnoses   Final diagnoses:  Epigastric abdominal pain    New Prescriptions New Prescriptions   No medications on file  I personally performed the services described in this documentation, which was scribed in my presence. The recorded information has been reviewed and is accurate.    Jeff KaplanAnkit Karee Christopherson, MD 05/25/16 0430

## 2016-06-16 ENCOUNTER — Encounter: Payer: Self-pay | Admitting: Gastroenterology

## 2016-06-16 ENCOUNTER — Ambulatory Visit (INDEPENDENT_AMBULATORY_CARE_PROVIDER_SITE_OTHER): Payer: 59 | Admitting: Gastroenterology

## 2016-06-16 VITALS — BP 148/97 | HR 101 | Temp 97.9°F | Ht 70.0 in | Wt 237.8 lb

## 2016-06-16 DIAGNOSIS — K449 Diaphragmatic hernia without obstruction or gangrene: Secondary | ICD-10-CM | POA: Diagnosis not present

## 2016-06-16 MED ORDER — PANTOPRAZOLE SODIUM 40 MG PO TBEC: 40.0000 mg | DELAYED_RELEASE_TABLET | Freq: Every day | ORAL | 3 refills | Status: AC

## 2016-06-16 NOTE — Patient Instructions (Signed)
Start taking Protonix 40 mg each morning, 30 minutes before breakfast. I sent this to your pharmacy. Keep up the good work with your dietary changes!  I have ordered an xray of your esophagus to further evaluate things.

## 2016-06-16 NOTE — Progress Notes (Signed)
Referring Provider: Lindley Magnus, PA-C Primary Care Physician:  Loyce Dys, PA-C Primary GI: Dr. Jena Gauss  Chief Complaint  Patient presents with  . Abdominal Pain    HPI:   [facility] is a 41 y.o. 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 EUS in May 2017 with normal esophagus, stomach, duodenum, and pancreas. Cholecystectomy in summer 2017.   Apr 04, 2016 lipase 127, otherwise has been normal. CMP normal Apr 04, 2016. CT scan not done in November.   Has had 3 separate episodes of LUQ discomfort/epigastric discomfort radiating up into chest. Watches what he eats. Following a low fat diet. Any kind of spaghetti sauce flares up symptoms. In January ate a piece of pizza. States doesn't feel like typical heartburn. Takes Naproxen. CT in Dec with stable small sliding hiatal hernia with diffuse wall thickening, question related to chronic inflammation. Not on a PPI.   Past Medical History:  Diagnosis Date  . ADHD (attention deficit hyperactivity disorder)   . Anxiety   . Arthritis   . Complication of anesthesia yrs ago   woke up during knee surgery  . Hypertension   . Pancreatitis   . Sinusitis     Past Surgical History:  Procedure Laterality Date  . CHOLECYSTECTOMY N/A 12/15/2015   Procedure: LAPAROSCOPIC CHOLECYSTECTOMY;  Surgeon: [name], MD;  Location: [facility];  Service: General;  Laterality: N/A;  . EUS N/A 09/23/2015   Dr. Christella Hartigan: examined esophagus, stomach, and duodenum were normal. Pancreas normal. Question of microlithiasis as culprit   . ORTHOPEDIC SURGERY     left knee surgery    Current Outpatient Prescriptions  Medication Sig Dispense Refill  . ALPRAZolam (XANAX) 0.5 MG tablet Take 0.5 mg by mouth 2 (two) times daily as needed.    Marland Kitchen amphetamine-dextroamphetamine (ADDERALL) 15 MG tablet Take 15 mg by mouth daily.    . B Complex Vitamins (B COMPLEX-B12 PO) Take 1 tablet by mouth daily.    Marland Kitchen lisinopril  (PRINIVIL,ZESTRIL) 20 MG tablet Take 20 mg by mouth daily.     . Omega-3 Fatty Acids (FISH OIL OMEGA-3 PO) Take 1 capsule by mouth daily.    Marland Kitchen oxyCODONE-acetaminophen (PERCOCET/ROXICET) 5-325 MG tablet Take 1 tablet by mouth every 6 (six) hours as needed for severe pain. 15 tablet 0  . promethazine (PHENERGAN) 25 MG tablet Take 25 mg by mouth every 6 (six) hours as needed for nausea or vomiting.    . famotidine (PEPCID) 20 MG tablet Take 1 tablet (20 mg total) by mouth 2 (two) times daily. (Patient not taking: Reported on 06/16/2016) 30 tablet 0  . Ginkgo Biloba (GNP GINGKO BILOBA EXTRACT PO) Take 1 capsule by mouth daily.    . ondansetron (ZOFRAN ODT) 4 MG disintegrating tablet Take 1 tablet (4 mg total) by mouth every 8 (eight) hours as needed for nausea or vomiting. (Patient not taking: Reported on 05/05/2016) 20 tablet 0  . ondansetron (ZOFRAN) 4 MG tablet Take 1 tablet (4 mg total) by mouth every 8 (eight) hours as needed for nausea or vomiting. (Patient not taking: Reported on 06/16/2016) 6 tablet 0  . PANAX GINSENG PO Take 1 capsule by mouth daily.     No current facility-administered medications for this visit.     Allergies as of 06/16/2016 - Review Complete 06/16/2016  Allergen Reaction Noted  . Doxycycline Swelling 12/09/2012  . Hctz [hydrochlorothiazide] Swelling 08/19/2015    Family History  Problem Relation Age of Onset  .  Heart failure Other   . Colon cancer Neg Hx     Social History   Social History  . Marital status: Single    Spouse name: N/A  . Number of children: N/A  . Years of education: N/A   Occupational History  . Employed    Social History Main Topics  . Smoking status: Current Some Day Smoker    Packs/day: 0.25    Years: 0.50    Types: Cigarettes  . Smokeless tobacco: Former NeurosurgeonUser    Types: Snuff    Quit date: 04/16/2015     Comment: pt instructed no to smoke day of surgery.  . Alcohol use No  . Drug use: No  . Sexual activity: Not Currently    Other Topics Concern  . None   Social History Narrative  . None    Review of Systems: As mentioned in HPI   Physical Exam: BP (!) 148/97   Pulse (!) 101   Temp 97.9 F (36.6 C) (Oral)   Ht 5\' 10"  (1.778 m)   Wt 237 lb 12.8 oz (107.9 kg)   BMI 34.12 kg/m  General:   Alert and oriented. No distress noted. Pleasant and cooperative.  Head:  Normocephalic and atraumatic. Eyes:  Conjuctiva clear without scleral icterus. Mouth:  Oral mucosa pink and moist. Good dentition. No lesions. Neck:  Supple, without mass or thyromegaly. Heart:  S1, S2 present without murmurs, rubs, or gallops. Regular rate and rhythm. Abdomen:  +BS, soft, non-tender and non-distended. No rebound or guarding. No HSM or masses noted. Msk:  Symmetrical without gross deformities. Normal posture. Extremities:  Without edema. Neurologic:  Alert and  oriented x4;  grossly normal neurologically. Psych:  Alert and cooperative. Normal mood and affect.  Lab Results  Component Value Date   WBC 8.2 05/24/2016   HGB 14.5 05/24/2016   HCT 41.4 05/24/2016   MCV 82.8 05/24/2016   PLT 362 05/24/2016   Lab Results  Component Value Date   ALT 33 05/24/2016   AST 26 05/24/2016   ALKPHOS 70 05/24/2016   BILITOT 0.8 05/24/2016   Lab Results  Component Value Date   CREATININE 0.99 05/24/2016   BUN 12 05/24/2016   NA 139 05/24/2016   K 4.0 05/24/2016   CL 105 05/24/2016   CO2 27 05/24/2016   Lab Results  Component Value Date   LIPASE 32 05/24/2016

## 2016-06-20 NOTE — Assessment & Plan Note (Signed)
41 year old male with history of pancreatitis secondary to likely microlithiasis, s/p cholecystectomy last summer. Bump in lipase a few months ago with CT imaging and non-specific. Recent imaging in December with stable small sliding hiatal hernia with diffuse wall thickening. Symptoms seem more related to symptomatic GERD at this time, not on a PPI, taking Naproxen. EGD at time of EUS last year overall normal. May ultimately need EGD; will pursue UGI first to further assess upper GI tract. Discussed dietary/behavior modification and addition of Protonix once daily. Further recommendations to follow.

## 2016-06-22 ENCOUNTER — Ambulatory Visit (HOSPITAL_COMMUNITY)
Admission: RE | Admit: 2016-06-22 | Discharge: 2016-06-22 | Disposition: A | Discharge status: Home / Self Care | Payer: 59 | Source: Ambulatory Visit | Attending: Gastroenterology | Admitting: Gastroenterology

## 2016-06-22 ENCOUNTER — Telehealth: Payer: Self-pay | Admitting: Internal Medicine

## 2016-06-22 DIAGNOSIS — K449 Diaphragmatic hernia without obstruction or gangrene: Secondary | ICD-10-CM | POA: Insufficient documentation

## 2016-06-22 DIAGNOSIS — K219 Gastro-esophageal reflux disease without esophagitis: Secondary | ICD-10-CM | POA: Diagnosis not present

## 2016-06-22 DIAGNOSIS — Z9049 Acquired absence of other specified parts of digestive tract: Secondary | ICD-10-CM | POA: Insufficient documentation

## 2016-06-22 NOTE — Progress Notes (Signed)
CC'ED TO PCP 

## 2016-06-22 NOTE — Telephone Encounter (Signed)
The patient's FMLA papers are with AB. I laid them in her office chair. Pt is aware that we will call him when completed.

## 2016-06-26 NOTE — Progress Notes (Signed)
Small hiatal hernia, mild reflux. How are his symptoms since PPI?

## 2016-06-27 NOTE — Telephone Encounter (Signed)
Completed.

## 2016-06-29 ENCOUNTER — Telehealth: Payer: Self-pay | Admitting: Internal Medicine

## 2016-06-29 NOTE — Telephone Encounter (Signed)
FMLA paper were completed and patient has paid his $29 and forms were faxed this morning to his employer ([employer]). Terri from [employer] at [employer] called this afternoon to ask how long does a flare up normally last. Is it for a few hours, days, week? I told her that I wouldn't know and I would ask the provider [facility] and would call her back. She agreed. Terri @ [employer] [phone]

## 2016-07-03 ENCOUNTER — Telehealth: Payer: Self-pay | Admitting: Internal Medicine

## 2016-07-03 NOTE — Telephone Encounter (Signed)
Ferne ReusSusan D Sherman at 06/29/2016 1:59 PM   Status: Signed    FMLA paper were completed and patient has paid his $29 and forms were faxed this morning to his employer ([employer]). Terri from HR at [employer] called this afternoon to ask how long does a flare up normally last. Is it for a few hours, days, week? I told her that I wouldn't know and I would ask the provider who filled out the [employer] [facility] [employer] FFMLA papers and would call her back. She agreed. Terri @ [employer] [phone]    Ferne ReusSusan D Sherman at 07/03/2016 1:08 PM   Status: Signed    Terri from [employer] called again today and [facility] about how long does a flare up normally last. I transferred message to JL VM and I also sent AB a phone note last week.

## 2016-07-03 NOTE — Telephone Encounter (Signed)
I called [employer] and spoke with Terri and she is aware of what AB said

## 2016-07-03 NOTE — Telephone Encounter (Signed)
Darl PikesSusan, did you talk with A[facility] Lofterry?

## 2016-07-03 NOTE — Telephone Encounter (Signed)
I called [employer] and [facility] is aware.

## 2016-07-03 NOTE — Telephone Encounter (Signed)
Terri from [employer] called again today and [facility] about how long does a flare up normally last. I transferred message to JL VM and I also sent AB a phone note last week.

## 2016-07-03 NOTE — Telephone Encounter (Signed)
Message from [place] at [employer]: she wants to know how often the patient will have a flare and how long they will last?

## 2016-07-03 NOTE — Telephone Encounter (Signed)
Hopefully, he won't be having flares too much longer now that we are providing supportive care/therapy. I would say 24 hours would be length of time passed. Unclear why he needed FMLA forms? I filled them out so that they would know he is followed by us and we are managing his chronic symptoms of abdominal pain.

## 2016-07-03 NOTE — Telephone Encounter (Signed)
I would say a day. Shouldn't be longer than that.

## 2016-07-11 ENCOUNTER — Encounter: Payer: Self-pay | Admitting: Gastroenterology

## 2016-07-11 NOTE — Progress Notes (Signed)
APPT MADE AND LETTER SENT  °

## 2016-10-15 IMAGING — DX DG CHEST 2V
2 series · 2 of 2 positions shown · non-contrast
Comparison: March 06, 2015

CLINICAL DATA: Chest pain for 1 day.  Shortness of breath.

EXAM:
CHEST  2 VIEW

[chest pa]
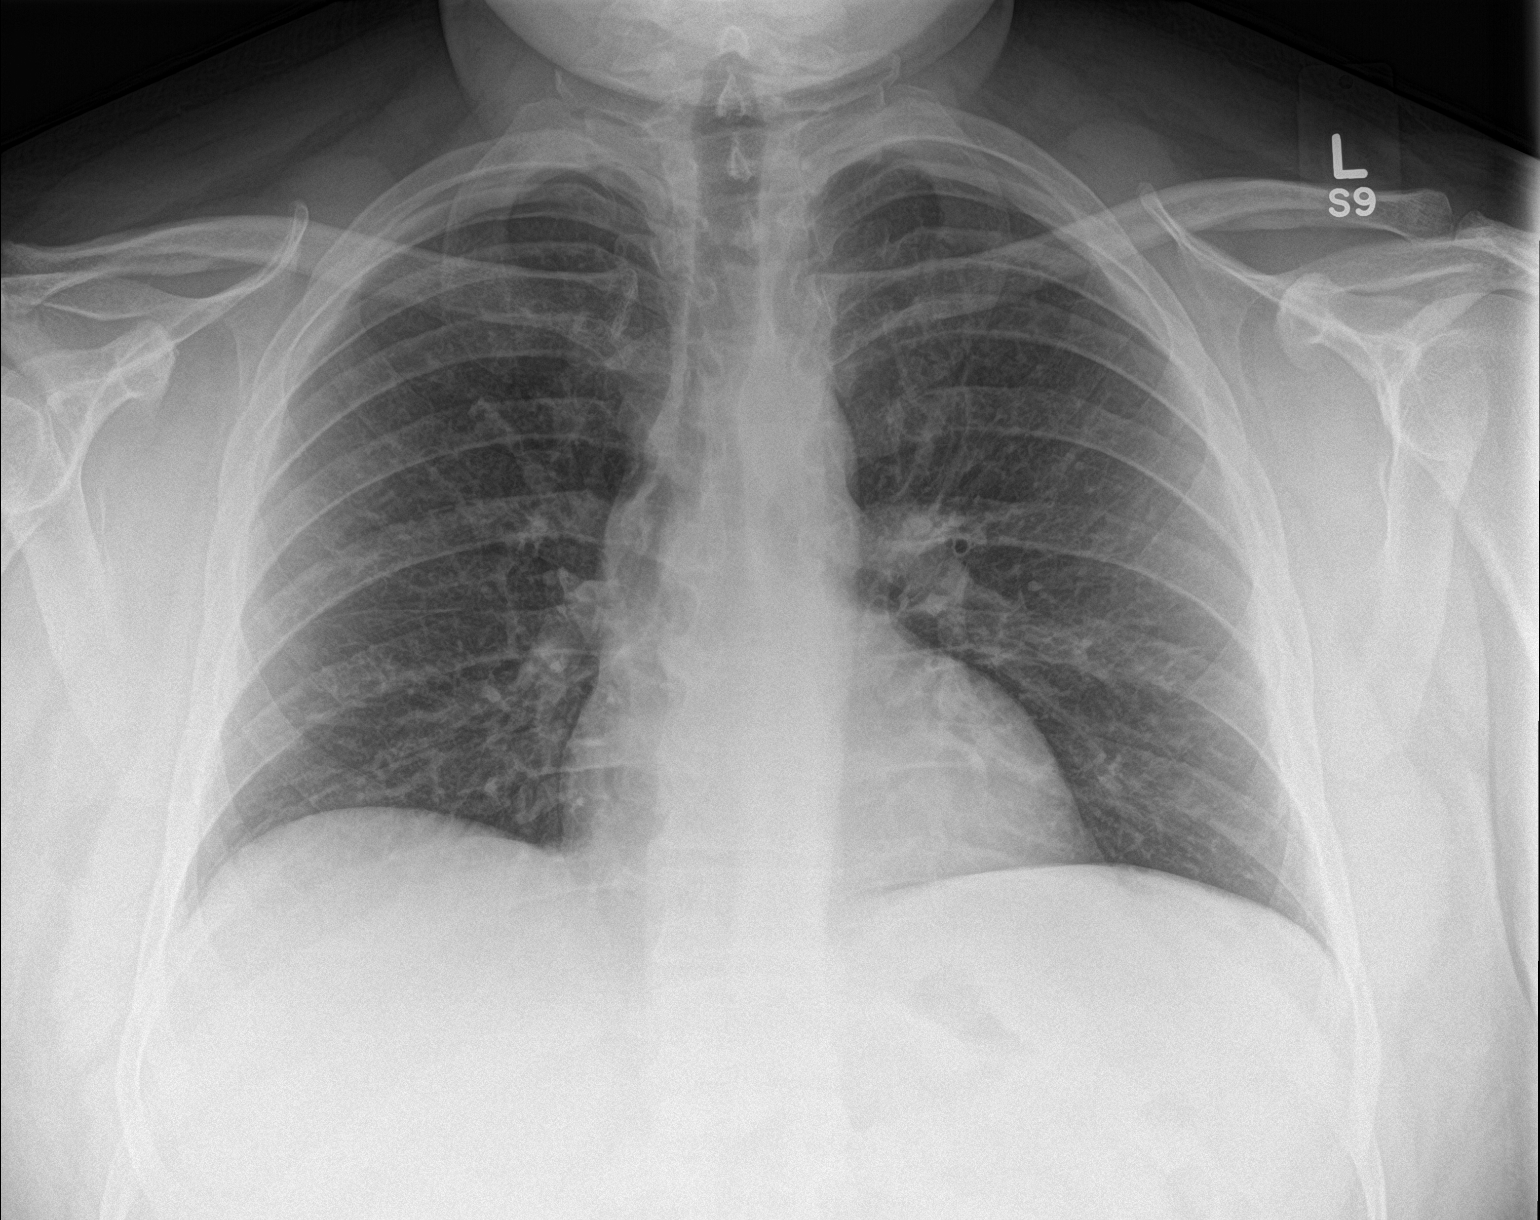

[chest lat]
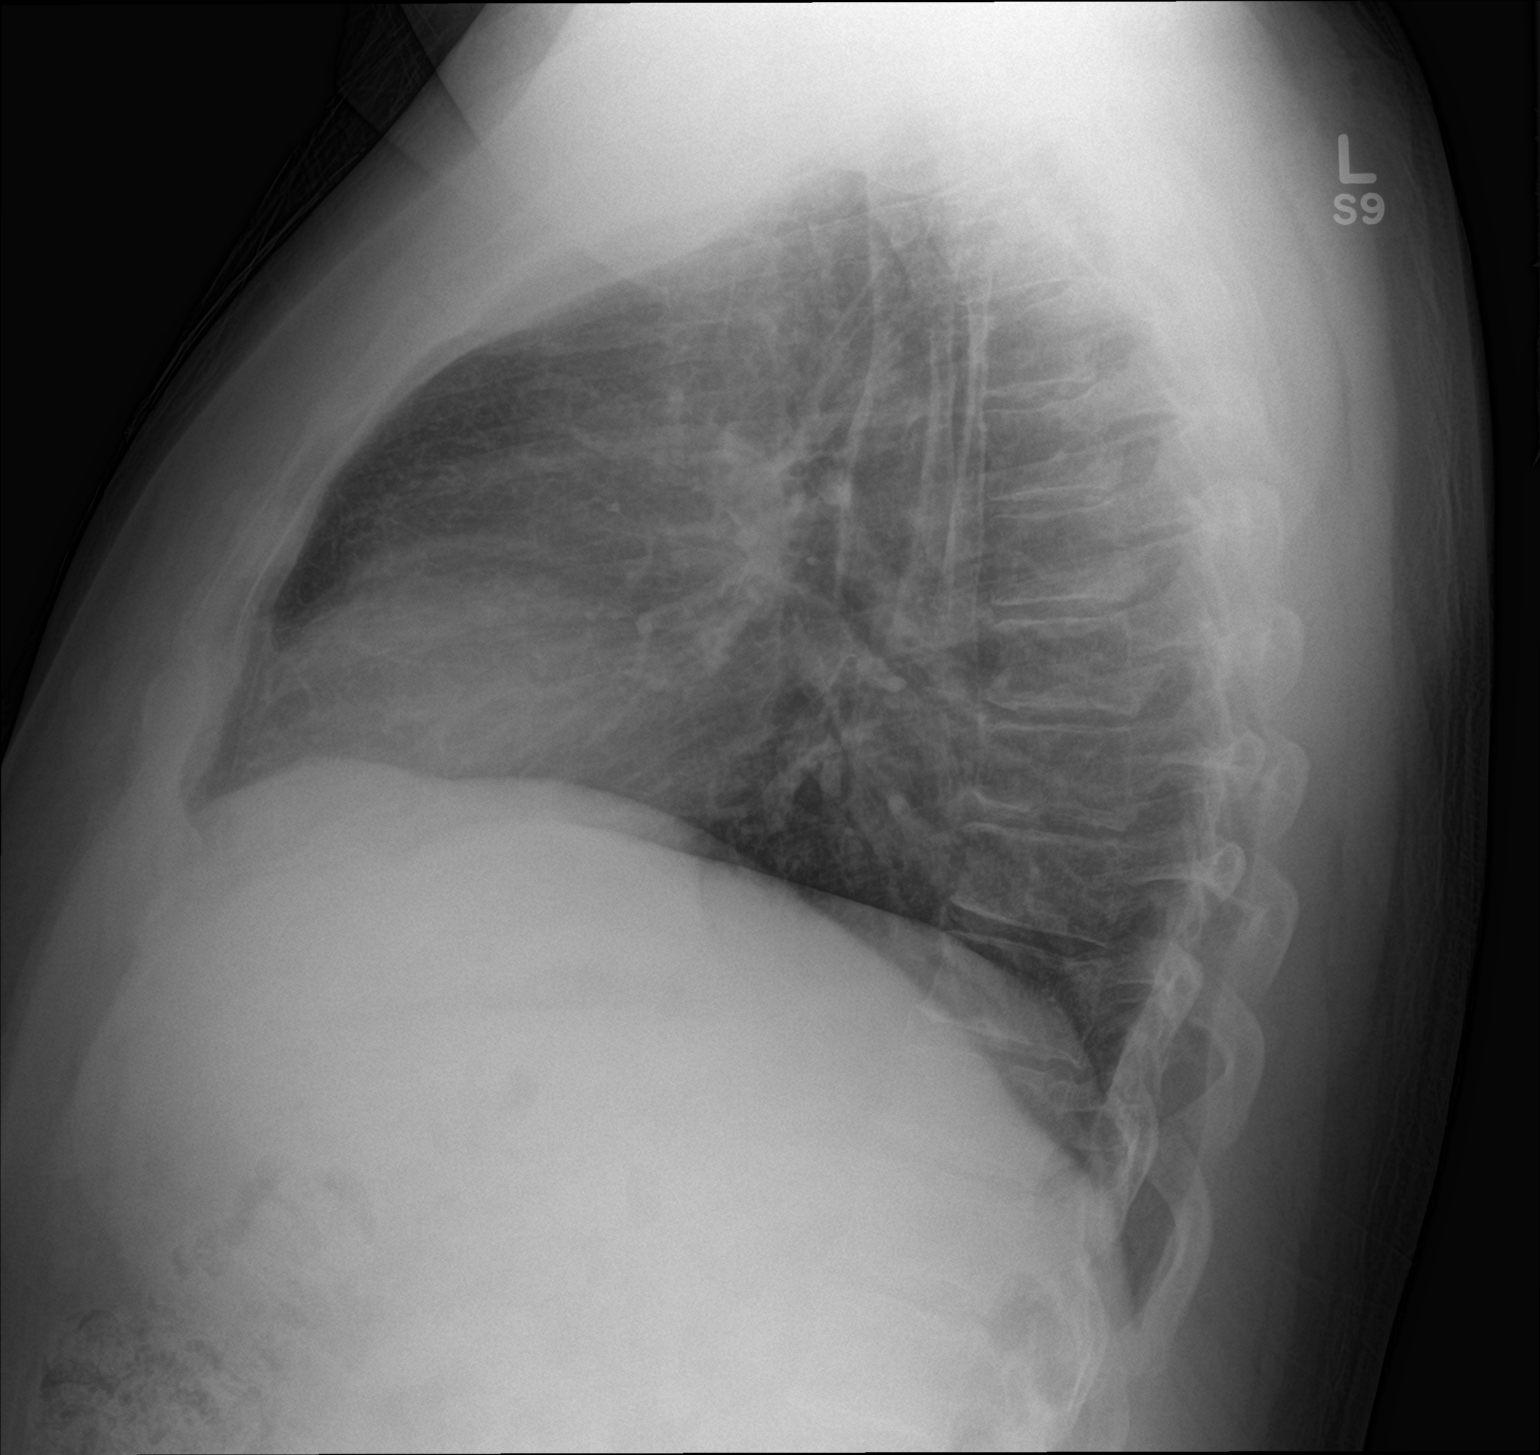

[2 of 2 positions shown; findings below may reference images not displayed]

FINDINGS: Lungs are clear. Heart size and pulmonary vascularity are normal. No
adenopathy. There is mild degenerative change in the thoracic spine.
No pneumothorax.
IMPRESSION: No edema or consolidation.

## 2017-01-08 ENCOUNTER — Ambulatory Visit: Payer: 59 | Admitting: Gastroenterology

## 2017-01-08 ENCOUNTER — Encounter: Payer: Self-pay | Admitting: Gastroenterology

## 2017-01-08 ENCOUNTER — Telehealth: Payer: Self-pay | Admitting: Gastroenterology

## 2017-01-08 NOTE — Telephone Encounter (Signed)
PATIENT WAS A NO SHOW AND LETTER SENT  °

## 2017-01-18 ENCOUNTER — Telehealth: Payer: Self-pay | Admitting: Gastroenterology

## 2017-01-18 ENCOUNTER — Encounter: Payer: Self-pay | Admitting: Gastroenterology

## 2017-01-18 NOTE — Telephone Encounter (Signed)
Please schedule ov.  

## 2017-01-18 NOTE — Telephone Encounter (Signed)
OV MADE °

## 2017-01-18 NOTE — Telephone Encounter (Signed)
Patient was a no-show 8/20. This week received FMLA forms. I last saw him in Jan 2018. FMLA forms were filled out in Feb 2018. What are these forms for? I do not have anything to go on for filling these forms out. He will need to be seen before filling out any more forms unless this is clear cut.

## 2017-03-14 ENCOUNTER — Telehealth: Payer: Self-pay | Admitting: Nurse Practitioner

## 2017-03-14 ENCOUNTER — Encounter: Payer: Self-pay | Admitting: Nurse Practitioner

## 2017-03-14 ENCOUNTER — Ambulatory Visit: Payer: 59 | Admitting: Gastroenterology

## 2017-03-14 NOTE — Telephone Encounter (Signed)
PATIENT WAS A NO SHOW AND LETTER SENT  °

## 2017-03-15 NOTE — Telephone Encounter (Signed)
Noted  

## 2017-12-26 ENCOUNTER — Emergency Department (HOSPITAL_COMMUNITY): Payer: BLUE CROSS/BLUE SHIELD

## 2017-12-26 ENCOUNTER — Emergency Department (HOSPITAL_COMMUNITY)
Admission: EM | Admit: 2017-12-26 | Discharge: 2017-12-26 | Disposition: A | Discharge status: Home / Self Care | Payer: BLUE CROSS/BLUE SHIELD | Source: Home / Self Care | Attending: Emergency Medicine | Admitting: Emergency Medicine

## 2017-12-26 ENCOUNTER — Emergency Department (HOSPITAL_COMMUNITY)
Admission: EM | Admit: 2017-12-26 | Discharge: 2017-12-26 | Disposition: A | Discharge status: Home / Self Care | Payer: BLUE CROSS/BLUE SHIELD | Attending: Emergency Medicine | Admitting: Emergency Medicine

## 2017-12-26 ENCOUNTER — Encounter (HOSPITAL_COMMUNITY): Payer: Self-pay

## 2017-12-26 ENCOUNTER — Encounter (HOSPITAL_COMMUNITY): Payer: Self-pay | Admitting: Emergency Medicine

## 2017-12-26 ENCOUNTER — Other Ambulatory Visit: Payer: Self-pay

## 2017-12-26 DIAGNOSIS — I10 Essential (primary) hypertension: Secondary | ICD-10-CM | POA: Insufficient documentation

## 2017-12-26 DIAGNOSIS — F17210 Nicotine dependence, cigarettes, uncomplicated: Secondary | ICD-10-CM | POA: Insufficient documentation

## 2017-12-26 DIAGNOSIS — Z79899 Other long term (current) drug therapy: Secondary | ICD-10-CM

## 2017-12-26 DIAGNOSIS — R1011 Right upper quadrant pain: Secondary | ICD-10-CM | POA: Diagnosis present

## 2017-12-26 DIAGNOSIS — R1010 Upper abdominal pain, unspecified: Secondary | ICD-10-CM

## 2017-12-26 DIAGNOSIS — K449 Diaphragmatic hernia without obstruction or gangrene: Secondary | ICD-10-CM

## 2017-12-26 DIAGNOSIS — K2970 Gastritis, unspecified, without bleeding: Secondary | ICD-10-CM | POA: Insufficient documentation

## 2017-12-26 DIAGNOSIS — R0789 Other chest pain: Secondary | ICD-10-CM | POA: Insufficient documentation

## 2017-12-26 LAB — TROPONIN I: Troponin I: <0.03 ng/mL (ref <0.03)

## 2017-12-26 LAB — CBC
HCT: 41.6 % (ref 39.0–52.0)
HEMATOCRIT: 39 % (ref 39.0–52.0)
HEMOGLOBIN: 13.3 g/dL (ref 13.0–17.0)
HEMOGLOBIN: 13.7 g/dL (ref 13.0–17.0)
MCH: 28.7 pg (ref 26.0–34.0)
MCH: 29 pg (ref 26.0–34.0)
MCHC: 32.9 g/dL (ref 30.0–36.0)
MCHC: 34.1 g/dL (ref 30.0–36.0)
MCV: 85.2 fL (ref 78.0–100.0)
MCV: 87 fL (ref 78.0–100.0)
Platelets: 346 10*3/uL (ref 150–400)
Platelets: 353 10*3/uL (ref 150–400)
RBC: 4.58 MIL/uL (ref 4.22–5.81)
RBC: 4.78 MIL/uL (ref 4.22–5.81)
RDW: 12.7 % (ref 11.5–15.5)
RDW: 12.9 % (ref 11.5–15.5)
WBC: 9.7 10*3/uL (ref 4.0–10.5)
WBC: 9.7 10*3/uL (ref 4.0–10.5)

## 2017-12-26 LAB — URINALYSIS, ROUTINE W REFLEX MICROSCOPIC
Bilirubin Urine: NEGATIVE
Glucose, UA: 50 mg/dL — AB
Hgb urine dipstick: NEGATIVE
Ketones, ur: NEGATIVE mg/dL
Leukocytes, UA: NEGATIVE
NITRITE: NEGATIVE
Protein, ur: NEGATIVE mg/dL
Specific Gravity, Urine: 1.021 (ref 1.005–1.030)
pH: 7 (ref 5.0–8.0)

## 2017-12-26 LAB — HEPATIC FUNCTION PANEL
ALT: 44 U/L (ref 0–44)
AST: 26 U/L (ref 15–41)
Albumin: 4 g/dL (ref 3.5–5.0)
Alkaline Phosphatase: 78 U/L (ref 38–126)
BILIRUBIN DIRECT: 0.1 mg/dL (ref 0.0–0.2)
Indirect Bilirubin: 0.5 mg/dL (ref 0.3–0.9)
TOTAL PROTEIN: 7.1 g/dL (ref 6.5–8.1)
Total Bilirubin: 0.6 mg/dL (ref 0.3–1.2)

## 2017-12-26 LAB — COMPREHENSIVE METABOLIC PANEL
ALT: 47 U/L — ABNORMAL HIGH (ref 0–44)
AST: 31 U/L (ref 15–41)
Albumin: 4.1 g/dL (ref 3.5–5.0)
Alkaline Phosphatase: 91 U/L (ref 38–126)
Anion gap: 7 (ref 5–15)
BILIRUBIN TOTAL: 0.7 mg/dL (ref 0.3–1.2)
BUN: 13 mg/dL (ref 6–20)
CALCIUM: 9.3 mg/dL (ref 8.9–10.3)
CO2: 28 mmol/L (ref 22–32)
Chloride: 106 mmol/L (ref 98–111)
Creatinine, Ser: 1.1 mg/dL (ref 0.61–1.24)
GFR calc non Af Amer: >60 mL/min (ref >60)
Glucose, Bld: 124 mg/dL — ABNORMAL HIGH (ref 70–99)
POTASSIUM: 4.4 mmol/L (ref 3.5–5.1)
SODIUM: 141 mmol/L (ref 135–145)
TOTAL PROTEIN: 7 g/dL (ref 6.5–8.1)

## 2017-12-26 LAB — BASIC METABOLIC PANEL
ANION GAP: 7 (ref 5–15)
BUN: 14 mg/dL (ref 6–20)
CALCIUM: 8.6 mg/dL — AB (ref 8.9–10.3)
CO2: 26 mmol/L (ref 22–32)
Chloride: 102 mmol/L (ref 98–111)
Creatinine, Ser: 0.97 mg/dL (ref 0.61–1.24)
Glucose, Bld: 128 mg/dL — ABNORMAL HIGH (ref 70–99)
POTASSIUM: 3.5 mmol/L (ref 3.5–5.1)
SODIUM: 135 mmol/L (ref 135–145)

## 2017-12-26 LAB — I-STAT TROPONIN, ED: Troponin i, poc: 0 ng/mL (ref 0.00–0.08)

## 2017-12-26 LAB — LIPASE, BLOOD
Lipase: 45 U/L (ref 11–51)
Lipase: 47 U/L (ref 11–51)

## 2017-12-26 LAB — D-DIMER, QUANTITATIVE: D-Dimer, Quant: <0.27 ug/mL-FEU (ref 0.00–0.50)

## 2017-12-26 MED ORDER — SODIUM CHLORIDE 0.9 % IV BOLUS
1000.0000 mL | Freq: Once | INTRAVENOUS | Status: AC
  Administered 2017-12-26: 1000 mL via INTRAVENOUS

## 2017-12-26 MED ORDER — IOHEXOL 300 MG/ML  SOLN
100.0000 mL | Freq: Once | INTRAMUSCULAR | Status: AC | PRN
  Administered 2017-12-26: 100 mL via INTRAVENOUS

## 2017-12-26 MED ORDER — RANITIDINE HCL 150 MG PO TABS: 150.0000 mg | ORAL_TABLET | Freq: Two times a day (BID) | ORAL | 0 refills | Status: DC

## 2017-12-26 MED ORDER — FAMOTIDINE IN NACL 20-0.9 MG/50ML-% IV SOLN
20.0000 mg | Freq: Once | INTRAVENOUS | Status: AC
  Administered 2017-12-26: 20 mg via INTRAVENOUS
  Filled 2017-12-26: qty 50

## 2017-12-26 NOTE — ED Provider Notes (Signed)
[facility] EMER[facility]Y DEPARTMENT Provider Note   CSN: [id number] Arrival date & time: 12/26/17  1534     History   Chief Complaint Chief Complaint  Patient presents with  . Abdominal Pain    HPI RAMESES OU is a 42 y.o. male with a PMHx of HTN, pancreatitis, hiatal hernia, GERD, kidney stones, and other conditions listed below, with PSHx of cholecystectomy, who presents to the ED with complaints of RUQ abd pain that began at 9am.  He describes his pain as 10/10 at worst but currently 2/10, constant waxing and waning thumping and occasionally sharp RUQ pain that radiates into the right flank, with no known aggravating factors, and no treatments tried prior to arrival.  Of note, chart review reveals that [facility] this [facility] for c/o R sided CP and SOB; his work up was unremarkable including neg CXR, CBC/BMP/LFTs/Lipase WNL, trop neg x2, and neg D-dimer.  He states that this pain is different than the pain he experienced earlier this morning, denies any ongoing chest pain or shortness of breath at this time.  He also denies that this feels like when he's had prior kidney stones.  He recently established care with Rueben Bash PA-C at [facility] in [place] (of note, was just called and told he has low Vitamin B12; reports numbness/tingling in his fingers, denies any worsening/new symptoms related to this).  He was previously seen by [facility] by Lewie Loron NP however he hasn't seen them since 06/16/16.  He takes Naprosyn about 3x/month.  He denies fevers, chills, CP, SOB, nausea/vomiting, diarrhea/constipation, obstipation, melena, hematochezia, hematuria, dysuria, urinary frequency, testicular pain/swelling, penile discharge, myalgias, arthralgias, new/worsening numbness/tingling, focal weakness, or any other complaints at this time. Denies recent travel, sick contacts, suspicious food intake, or EtOH use.   The history is provided by the patient and  medical records. No language interpreter was used.    Past Medical History:  Diagnosis Date  . ADHD (attention deficit hyperactivity disorder)   . Anxiety   . Arthritis   . Complication of anesthesia yrs ago   woke up during knee surgery  . Hypertension   . Pancreatitis   . Sinusitis     Patient Active Problem List   Diagnosis Date Noted  . Hiatal hernia 06/16/2016  . Pancreatitis, acute 08/25/2015  . RUQ pain 08/25/2015  . Palpitations 04/06/2011  . Hypertension 04/06/2011  . Heartburn 04/06/2011    Past Surgical History:  Procedure Laterality Date  . CHOLECYSTECTOMY N/A 12/15/2015   Procedure: LAPAROSCOPIC CHOLECYSTECTOMY;  Surgeon: [name], MD;  Location: [facility];  Service: General;  Laterality: N/A;  . EUS N/A 09/23/2015   Dr. Christella Hartigan: examined esophagus, stomach, and duodenum were normal. Pancreas normal. Question of microlithiasis as culprit   . ORTHOPEDIC SURGERY     left knee surgery        Home Medications    Prior to Admission medications   Medication Sig Start Date End Date Taking? Authorizing Provider  ALPRAZolam Prudy Feeler) 0.5 MG tablet Take 0.5 mg by mouth 2 (two) times daily as needed. 04/17/16   [provider]  amphetamine-dextroamphetamine (ADDERALL) 15 MG tablet Take 15 mg by mouth daily. 04/17/16 04/17/17  [provider]  B Complex Vitamins (B COMPLEX-B12 PO) Take 1 tablet by mouth daily.    [provider]  Ginkgo Biloba (GNP GINGKO BILOBA EXTRACT PO) Take 1 capsule by mouth daily.    [provider]  lisinopril (  PRINIVIL,ZESTRIL) 20 MG tablet Take 20 mg by mouth daily.     [provider]  Omega-3 Fatty Acids (FISH OIL OMEGA-3 PO) Take 1 capsule by mouth daily.    [provider]  oxyCODONE-acetaminophen (PERCOCET/ROXICET) 5-325 MG tablet Take 1 tablet by mouth every 6 (six) hours as needed for severe pain. 04/04/16   Horton, Mayer Maskerourtney F, MD  PANAX GINSENG PO Take 1 capsule by mouth daily.     [provider]  pantoprazole (PROTONIX) 40 MG tablet Take 1 tablet (40 mg total) by mouth daily. 30 minutes before breakfast 06/16/16   Gelene MinkBoone, Anna W, NP  promethazine (PHENERGAN) 25 MG tablet Take 25 mg by mouth every 6 (six) hours as needed for nausea or vomiting.    [provider]    Family History Family History  Problem Relation Age of Onset  . Heart failure Other   . Colon cancer Neg Hx     Social History Social History   Tobacco Use  . Smoking status: Current Some Day Smoker    Packs/day: 0.25    Years: 0.50    Pack years: 0.12    Types: Cigarettes  . Smokeless tobacco: Former NeurosurgeonUser    Types: Snuff    Quit date: 04/16/2015  . Tobacco comment: pt instructed no to smoke day of surgery.  Substance Use Topics  . Alcohol use: No    Alcohol/week: 0.0 oz  . Drug use: No     Allergies   Doxycycline and Hctz [hydrochlorothiazide]   Review of Systems Review of Systems  Constitutional: Negative for chills and fever.  Respiratory: Negative for shortness of breath.   Cardiovascular: Negative for chest pain.  Gastrointestinal: Positive for abdominal pain. Negative for blood in stool, constipation, diarrhea, nausea and vomiting.  Genitourinary: Positive for flank pain (from abdomen). Negative for discharge, dysuria, frequency, hematuria, scrotal swelling and testicular pain.  Musculoskeletal: Negative for arthralgias and myalgias.  Skin: Negative for color change.  Allergic/Immunologic: Negative for immunocompromised state.  Neurological: Negative for weakness and numbness.  Psychiatric/Behavioral: Negative for confusion.   All other systems reviewed and are negative for acute change except as noted in the HPI.    Physical Exam Updated Vital Signs BP 122/79 (BP Location: Right Arm)   Pulse 77   Temp 98.8 F (37.1 C) (Oral)   Ht 5\' 10"  (1.778 m)   Wt 117 kg (258 lb)   SpO2 100%   BMI 37.02 kg/m   Physical Exam  Constitutional: He is oriented to  person, place, and time. Vital signs are normal. He appears well-developed and well-nourished.  Non-toxic appearance. No distress.  Afebrile, nontoxic, NAD  HENT:  Head: Normocephalic and atraumatic.  Mouth/Throat: Oropharynx is clear and moist and mucous membranes are normal.  Eyes: Conjunctivae and EOM are normal. Right eye exhibits no discharge. Left eye exhibits no discharge.  Neck: Normal range of motion. Neck supple.  Cardiovascular: Normal rate, regular rhythm, normal heart sounds and intact distal pulses. Exam reveals no gallop and no friction rub.  No murmur heard. Pulmonary/Chest: Effort normal and breath sounds normal. No respiratory distress. He has no decreased breath sounds. He has no wheezes. He has no rhonchi. He has no rales.  Abdominal: Soft. Normal appearance and bowel sounds are normal. He exhibits no distension. There is tenderness in the right upper quadrant and epigastric area. There is no rigidity, no rebound, no guarding, no CVA tenderness, no tenderness at McBurney's point and negative Murphy's sign. No hernia.  Soft, nondistended, +BS throughout, with moderate epigastric and RUQ TTP, no r/g/r, pain elicited with murphy's exam however pt able to fully inspire, neg mcburney's, no CVA TTP, no hernias appreciated during valsalva.   Musculoskeletal: Normal range of motion.  Neurological: He is alert and oriented to person, place, and time. He has normal strength. No sensory deficit.  Skin: Skin is warm, dry and intact. No rash noted.  Psychiatric: He has a normal mood and affect.  Nursing note and vitals reviewed.    ED Treatments / Results  Labs (all labs ordered are listed, but only abnormal results are displayed) Labs Reviewed  COMPREHENSIVE METABOLIC PANEL - Abnormal; Notable for the following components:      Result Value   Glucose, Bld 124 (*)    ALT 47 (*)    All other components within normal limits  URINALYSIS, ROUTINE W REFLEX MICROSCOPIC - Abnormal;  Notable for the following components:   Glucose, UA 50 (*)    All other components within normal limits  LIPASE, BLOOD  CBC    EKG None  Radiology Dg Chest 2 View  Result Date: 12/26/2017 CLINICAL DATA:  Right hand numbness for 3-4 days with right-sided chest pain and dyspnea. EXAM: CHEST - 2 VIEW COMPARISON:  05/24/2016 CXR FINDINGS: Heart size and mediastinal contours are stable. No pulmonary consolidation, effusion or pneumothorax. Minimal atelectasis is seen at the left base. No acute osseous abnormality is noted. IMPRESSION: Stable appearance of the chest without acute cardiopulmonary disease. Electronically Signed   By: Tollie Eth M.D.   On: 12/26/2017 01:01   Ct Abdomen Pelvis W Contrast  Result Date: 12/26/2017 CLINICAL DATA:  Right upper quadrant and epigastric abdominal pain. Previous cholecystectomy. EXAM: CT ABDOMEN AND PELVIS WITH CONTRAST TECHNIQUE: Multidetector CT imaging of the abdomen and pelvis was performed using the standard protocol following bolus administration of intravenous contrast. CONTRAST:  OMNIPAQUE IOHEXOL 300 MG/ML  SOLN COMPARISON:  05/05/2016. FINDINGS: Lower chest: Unremarkable. Hepatobiliary: Mild diffuse low density of the liver relative to the spleen. Cholecystectomy clips. Pancreas: Unremarkable. No pancreatic ductal dilatation or surrounding inflammatory changes. Spleen: Normal in size without focal abnormality. Adrenals/Urinary Tract: Adrenal glands are unremarkable. Kidneys are normal, without renal calculi, focal lesion, or hydronephrosis. Bladder is unremarkable. Stomach/Bowel: Small hiatal hernia without the previously seen wall thickening. Otherwise, the stomach is within normal limits. Appendix appears normal. No evidence of bowel wall thickening, distention, or inflammatory changes. Vascular/Lymphatic: Minimal atheromatous arterial calcification. No enlarged lymph nodes. Reproductive: Prostate is unremarkable. Other: Small supraumbilical and tiny  umbilical hernias containing fat. Musculoskeletal: Mild lumbar and lower thoracic spine degenerative changes. IMPRESSION: No acute abnormality.  Small hiatal hernia. Electronically Signed   By: Beckie Salts M.D.   On: 12/26/2017 18:37    Procedures Procedures (including critical care time)  Medications Ordered in ED Medications  sodium chloride 0.9 % bolus 1,000 mL (0 mLs Intravenous Stopped 12/26/17 1904)  famotidine (PEPCID) IVPB 20 mg premix (0 mg Intravenous Stopped 12/26/17 1812)  iohexol (OMNIPAQUE) 300 MG/ML solution 100 mL (100 mLs Intravenous Contrast Given 12/26/17 1812)     Initial Impression / Assessment and Plan / ED Course  I have reviewed the triage vital signs and the nursing notes.  Pertinent labs & imaging results that were available during my care of the patient were reviewed by me and considered in my medical decision making (see chart for details).     42 y.o. male here with RUQ pain that began at 9am. On  exam, moderate RUQ and epigastric TTP, pain with murphy's exam however able to fully inspire, no flank tenderness, nonperitoneal, no hernias appreciated. He was [facility] at the [facility] for R chest pain and SOB which he states feels different than his current symptoms; his evaluation there was unremarkable including CBC/BMP/LFTs and lipase WNL, trop neg x2, D-dimer neg, CXR neg. DDx includes biliary tree stone, pancreatitis, gastritis/GERD, kidney stone, vs diverticulitis, obstruction, etc although these are less likely. Work up thus far reveals: CBC WNL. Lipase and CMP pending, U/A not yet collected. Will proceed with CT abd/pelv to further evaluate etiology, pt declines wanting pain meds, will give pepcid and fluids and reassess shortly.   7:29 PM CMP with marginally elevated ALT 47 but otherwise unremarkable. Lipase WNL. U/A unremarkable without evidence of UTI or hematuria. CT abd/pelv with mild low density of liver relative to spleen but otherwise no findings  in biliary tree, no kidney stones, small hiatal hernia noted but similar to prior CT scan, small supraumbilical and tiny umbilical hernias containing fat, but no acute findings. Pt feeling well at this time. Overall, symptoms seem consistent with GERD/gastritis/PUD possibly related to his hiatal hernia and NSAID use. Discussed diet/lifestyle modifications for symptoms, will start on zantac, advised tylenol and avoidance/sparing use of NSAIDs only on full stomach, discussed other OTC remedies for symptomatic relief, and f/up with PCP in 5-7 days for recheck of symptoms and ongoing evaluation/management. Doubt other emergent pathology requiring further emergent work up at this time. I explained the diagnosis and have given explicit precautions to return to the ER including for any other new or worsening symptoms. The patient understands and accepts the medical plan as it's been dictated and I have answered their questions. Discharge instructions concerning home care and prescriptions have been given. The patient is STABLE and is discharged to home in good condition.    Final Clinical Impressions(s) / ED Diagnoses   Final diagnoses:  Upper abdominal pain  Hiatal hernia  Gastritis, presence of bleeding unspecified, unspecified chronicity, unspecified gastritis type    ED Discharge Orders        Ordered    ranitidine (ZANTAC) 150 MG tablet  2 times daily     12/26/17 [address], [place], [place] 12/26/17 1930    Terrilee Files, MD 12/27/17 [phone]

## 2017-12-26 NOTE — ED Triage Notes (Signed)
Pt endorses right sided abd pain since 0900. Has hx of pancreatitis in 2017 before having gallbladder taken out. Denies alcohol use. VSS.

## 2017-12-26 NOTE — Discharge Instructions (Signed)
Your work up today has been very reassuring, your CT scan shows a small hiatal hernia which is unchanged from before. Your abdominal pain could be from a variety of different causes, including from your hiatal hernia, or from gastritis or an ulcer. You will need to take zantac as directed, and avoid spicy/fatty/acidic foods, avoid soda/coffee/tea/alcohol. Avoid laying down flat within 30 minutes of eating. Avoid NSAIDs like ibuprofen/aleve/motrin/etc on an empty stomach. May consider using over the counter tums/maalox as needed for additional relief. Use tylenol as needed for pain. Follow up with your regular doctor in 5-7 days for recheck of symptoms. Return to the ER for changes or worsening symptoms.  Abdominal (belly) pain can be caused by many things. Your caregiver performed an examination and possibly ordered blood/urine tests and imaging (CT scan, x-rays, ultrasound). Many cases can be observed and treated at home after initial evaluation in the emergency department. Even though you are being discharged home, abdominal pain can be unpredictable. Therefore, you need a repeated exam if your pain does not resolve, returns, or worsens. Most patients with abdominal pain don't have to be admitted to the hospital or have surgery, but serious problems like appendicitis and gallbladder attacks can start out as nonspecific pain. Many abdominal conditions cannot be diagnosed in one visit, so follow-up evaluations are very important. SEEK IMMEDIATE MEDICAL ATTENTION IF YOU DEVELOP ANY OF THE FOLLOWING SYMPTOMS: The pain does not go away or becomes severe.  A temperature above 101 develops.  Repeated vomiting occurs (multiple episodes).  The pain becomes localized to portions of the abdomen. The right side could possibly be appendicitis. In an adult, the left lower portion of the abdomen could be colitis or diverticulitis.  Blood is being passed in stools or vomit (bright red or black tarry stools).  Return also  if you develop chest pain, difficulty breathing, dizziness or fainting, or become confused, poorly responsive, or inconsolable (young children). The constipation stays for more than 4 days.  There is belly (abdominal) or rectal pain.  You do not seem to be getting better.

## 2017-12-26 NOTE — ED Notes (Signed)
Patient transported to CT 

## 2017-12-26 NOTE — ED Triage Notes (Addendum)
Pt C/O chest pain that started around 1 hour ago and left hand numbness that began 2 weeks ago. Pt denies N/V. Pt stating it hurts worse with a deep breath.

## 2017-12-26 NOTE — ED Provider Notes (Signed)
[facility] EMERGENCY DEPARTMENT Provider Note   CSN: [id number] Arrival date & time: 12/26/17  0005     History   Chief Complaint Chief Complaint  Patient presents with  . Chest Pain    HPI Jeff Buck is a 42 y.o. male.  Patient with history of hypertension, anxiety, pancreatitis presenting with right-sided chest pain that woke him from sleep about 11 PM.  States this pain was sharp and constant and lasted about 2-1/2 hours before resolving on its own.  He feels back to baseline now.  It was associate with some shortness of breath and pain with breathing.  Denies any diaphoresis, nausea, vomiting, syncope.  States this is very different than his typical pancreatitis type pain.  He did have a brief episode of radiation to his right upper back that lasted a few minutes but now feels perfectly fine.  No focal weakness.  He has had bilateral numbness in his hands for the past 2 weeks for which she saw his primary doctor today and that is unchanged.  Denies any cardiac history.  The history is provided by the patient.  Chest Pain   Associated symptoms include numbness and shortness of breath. Pertinent negatives include no abdominal pain, no dizziness, no headaches, no nausea, no vomiting and no weakness.    Past Medical History:  Diagnosis Date  . ADHD (attention deficit hyperactivity disorder)   . Anxiety   . Arthritis   . Complication of anesthesia yrs ago   woke up during knee surgery  . Hypertension   . Pancreatitis   . Sinusitis     Patient Active Problem List   Diagnosis Date Noted  . Hiatal hernia 06/16/2016  . Pancreatitis, acute 08/25/2015  . RUQ pain 08/25/2015  . Palpitations 04/06/2011  . Hypertension 04/06/2011  . Heartburn 04/06/2011    Past Surgical History:  Procedure Laterality Date  . CHOLECYSTECTOMY N/A 12/15/2015   Procedure: LAPAROSCOPIC CHOLECYSTECTOMY;  Surgeon: [name], MD;  Location: [facility];  Service: General;  Laterality: N/A;  .  EUS N/A 09/23/2015   Dr. Christella Hartigan: examined esophagus, stomach, and duodenum were normal. Pancreas normal. Question of microlithiasis as culprit   . ORTHOPEDIC SURGERY     left knee surgery        Home Medications    Prior to Admission medications   Medication Sig Start Date End Date Taking? Authorizing Provider  ALPRAZolam Prudy Feeler) 0.5 MG tablet Take 0.5 mg by mouth 2 (two) times daily as needed. 04/17/16   [provider]  amphetamine-dextroamphetamine (ADDERALL) 15 MG tablet Take 15 mg by mouth daily. 04/17/16 04/17/17  [provider]  B Complex Vitamins (B COMPLEX-B12 PO) Take 1 tablet by mouth daily.    [provider]  Ginkgo Biloba (GNP GINGKO BILOBA EXTRACT PO) Take 1 capsule by mouth daily.    [provider]  lisinopril (PRINIVIL,ZESTRIL) 20 MG tablet Take 20 mg by mouth daily.     [provider]  Omega-3 Fatty Acids (FISH OIL OMEGA-3 PO) Take 1 capsule by mouth daily.    [provider]  oxyCODONE-acetaminophen (PERCOCET/ROXICET) 5-325 MG tablet Take 1 tablet by mouth every 6 (six) hours as needed for severe pain. 04/04/16   Horton, Mayer Masker, MD  PANAX GINSENG PO Take 1 capsule by mouth daily.    [provider]  pantoprazole (PROTONIX) 40 MG tablet Take 1 tablet (40 mg total) by mouth daily. 30 minutes before breakfast 06/16/16   Gelene Mink, NP  promethazine (PHENERGAN) 25 MG tablet Take 25 mg by mouth every 6 (six) hours as needed for nausea or vomiting.    [provider]    Family History Family History  Problem Relation Age of Onset  . Heart failure Other   . Colon cancer Neg Hx     Social History Social History   Tobacco Use  . Smoking status: Current Some Day Smoker    Packs/day: 0.25    Years: 0.50    Pack years: 0.12    Types: Cigarettes  . Smokeless tobacco: Former NeurosurgeonUser    Types: Snuff    Quit date: 04/16/2015  . Tobacco comment: pt instructed no to smoke day of surgery.    Substance Use Topics  . Alcohol use: No    Alcohol/week: 0.0 oz  . Drug use: No     Allergies   Doxycycline and Hctz [hydrochlorothiazide]   Review of Systems Review of Systems  Constitutional: Negative for activity change and appetite change.  HENT: Negative for congestion and rhinorrhea.   Eyes: Negative for visual disturbance.  Respiratory: Positive for chest tightness and shortness of breath.   Cardiovascular: Positive for chest pain.  Gastrointestinal: Negative for abdominal pain, nausea and vomiting.  Genitourinary: Negative for dysuria, hematuria and testicular pain.  Musculoskeletal: Negative for arthralgias and myalgias.  Skin: Negative for rash.  Neurological: Positive for numbness. Negative for dizziness, weakness and headaches.    all other systems are negative except as noted in the HPI and PMH.    Physical Exam Updated Vital Signs BP 123/83 (BP Location: Left Arm)   Pulse 70   Temp 98.2 F (36.8 C) (Oral)   Resp 15   Wt 107.5 kg (237 lb)   SpO2 99%   BMI 34.01 kg/m   Physical Exam  Constitutional: He is oriented to person, place, and time. He appears well-developed and well-nourished. No distress.  HENT:  Head: Normocephalic and atraumatic.  Mouth/Throat: Oropharynx is clear and moist. No oropharyngeal exudate.  Eyes: Pupils are equal, round, and reactive to light. Conjunctivae and EOM are normal.  Neck: Normal range of motion. Neck supple.  No meningismus.  Cardiovascular: Normal rate, regular rhythm, normal heart sounds and intact distal pulses.  No murmur heard. Equal radial pulses and grip strength  Pulmonary/Chest: Effort normal and breath sounds normal. No respiratory distress. He exhibits no tenderness.  Abdominal: Soft. There is no tenderness. There is no rebound and no guarding.  Musculoskeletal: Normal range of motion. He exhibits no edema or tenderness.  Neurological: He is alert and oriented to person, place, and time. No cranial nerve  deficit. He exhibits normal muscle tone. Coordination normal.  No ataxia on finger to nose bilaterally. No pronator drift. 5/5 strength throughout. CN 2-12 intact.Equal grip strength. Sensation intact.   Skin: Skin is warm.  Psychiatric: He has a normal mood and affect. His behavior is normal.  Nursing note and vitals reviewed.    ED Treatments / Results  Labs (all labs ordered are listed, but only abnormal results are displayed) Labs Reviewed  BASIC METABOLIC PANEL - Abnormal; Notable for the following components:      Result Value   Glucose, Bld 128 (*)    Calcium 8.6 (*)    All other components within normal limits  CBC  HEPATIC FUNCTION PANEL  LIPASE, BLOOD  TROPONIN I  TROPONIN I  D-DIMER, QUANTITATIVE (NOT AT [facility])  I-STAT TROPONIN, ED    EKG EKG Interpretation  Date/Time:  Wednesday December 26 2017 00:15:22 EDT Ventricular Rate:  76 PR Interval:  184 QRS Duration: 90 QT Interval:  356 QTC Calculation: 400 R Axis:   1 Text Interpretation:  Normal sinus rhythm Moderate voltage criteria for LVH, may be normal variant Borderline ECG No significant change was found Confirmed by Glynn Octave [phone]) on 12/26/2017 12:27:52 AM   Radiology Dg Chest 2 View  Result Date: 12/26/2017 CLINICAL DATA:  Right hand numbness for 3-4 days with right-sided chest pain and dyspnea. EXAM: CHEST - 2 VIEW COMPARISON:  05/24/2016 CXR FINDINGS: Heart size and mediastinal contours are stable. No pulmonary consolidation, effusion or pneumothorax. Minimal atelectasis is seen at the left base. No acute osseous abnormality is noted. IMPRESSION: Stable appearance of the chest without acute cardiopulmonary disease. Electronically Signed   By: Tollie Eth M.D.   On: 12/26/2017 01:01    Procedures Procedures (including critical care time)  Medications Ordered in ED Medications - No data to display   Initial Impression / Assessment and Plan / ED Course  I have reviewed the triage vital signs  and the nursing notes.  Pertinent labs & imaging results that were available during my care of the patient were reviewed by me and considered in my medical decision making (see chart for details).    Patient with 2 oh hours of right-sided chest pain, shortness of breath that is since resolved.  EKG is unchanged and nonischemic.  Chest wall is nontender.  Chest x-ray is negative.  Troponin is negative.  Chest pain has resolved.  Appears atypical for ACS.  D-dimer is negative.  Troponin negative x2.  Patient symptom-free.  Low suspicion for ACS, pulmonary embolism, aortic dissection.  Chest x-ray is negative.  Advise follow-up with PCP as well as cardiology for stress test.  Return precautions discussed including exertional chest pain, sweating,  vomiting or any other concerns. Final Clinical Impressions(s) / ED Diagnoses   Final diagnoses:  Atypical chest pain    ED Discharge Orders    None       Joziyah Roblero, Jeannett Senior, MD 12/26/17 [phone]

## 2017-12-26 NOTE — Discharge Instructions (Addendum)
There is no evidence of heart attack or blood clot in the lung.  Follow-up with your primary doctor as well as a cardiologist for a stress test.  Return to the ED if your chest pain becomes exertional, associated with shortness of breath, vomiting, sweating or any other concerns.

## 2018-01-01 IMAGING — US US ABDOMEN LIMITED
1 series · 14 of 25 positions shown · non-contrast
Comparison: CT scan 08/19/2015

CLINICAL DATA: Right upper quadrant pain

EXAM:
US ABDOMEN LIMITED - RIGHT UPPER QUADRANT

[Series 1: us abdomen limited · 0.25mm/px · 14 of 60 slices shown]
[im 1/60]
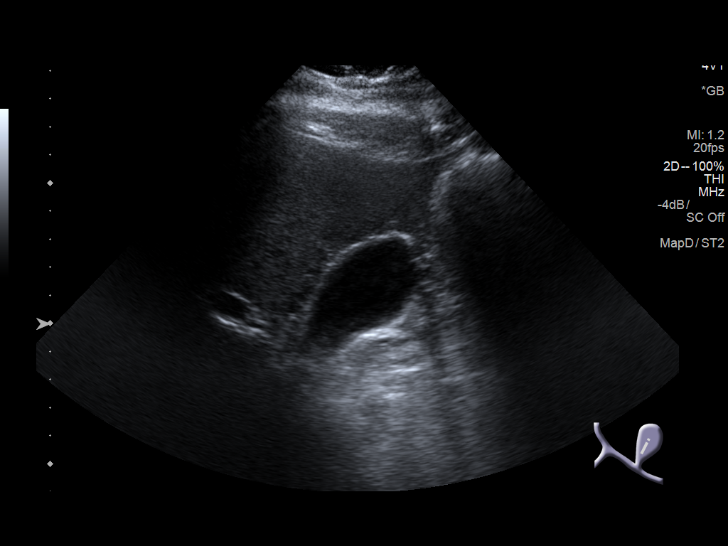
[im 5/60]
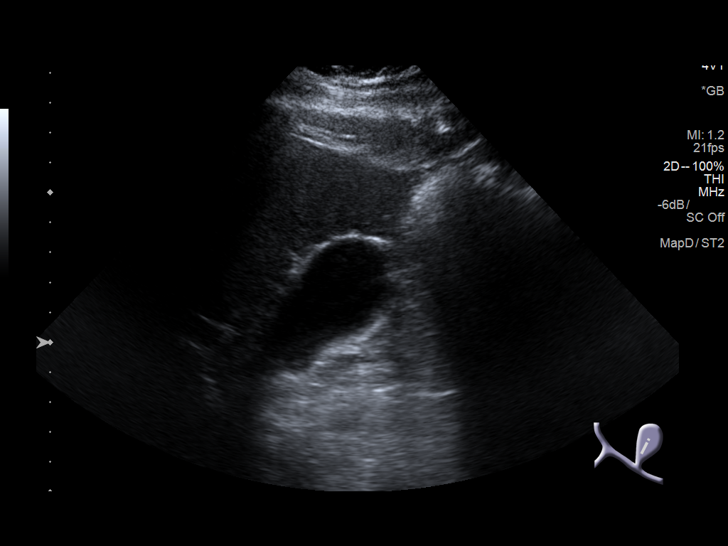
[im 10/60]
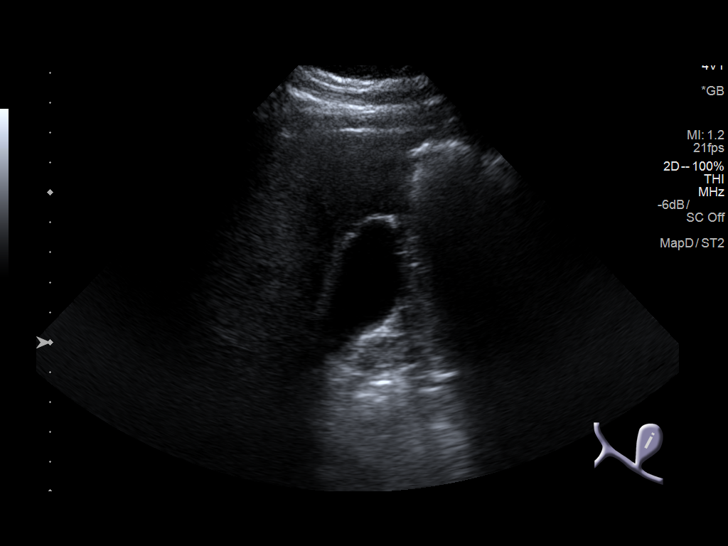
[im 15/60]
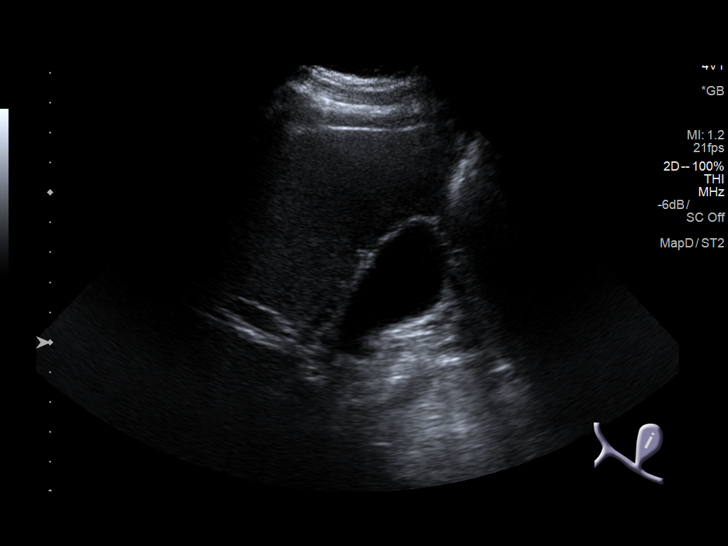
[im 20/60]
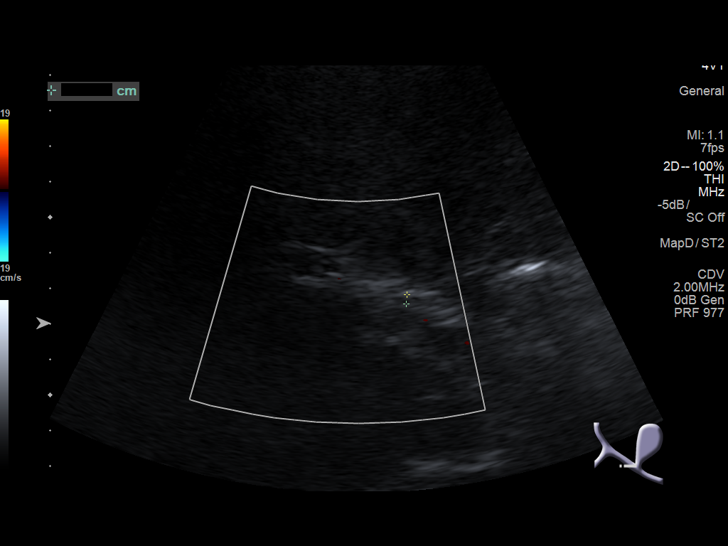
[im 23/60]
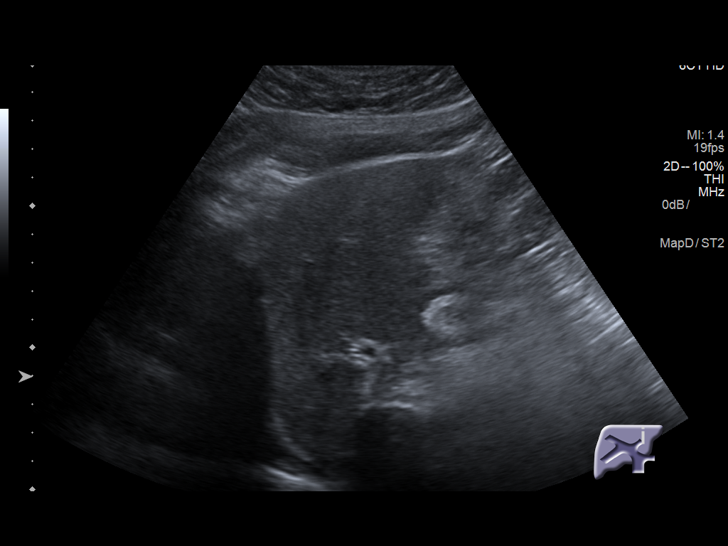
[im 28/60]
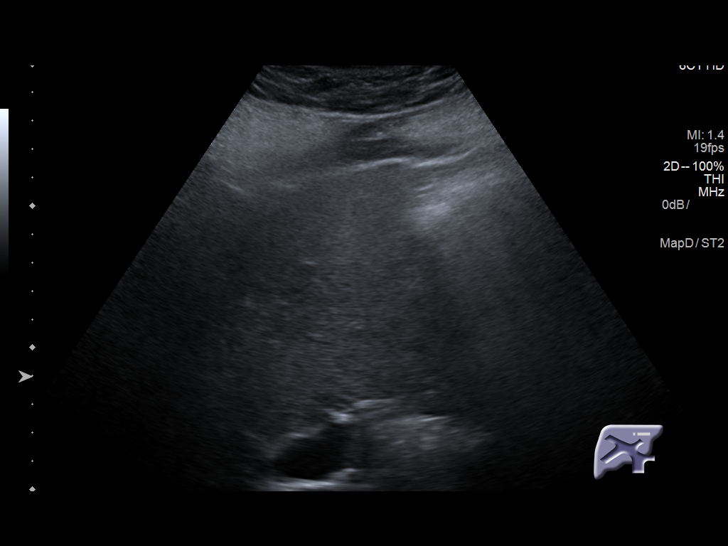
[im 32/60]
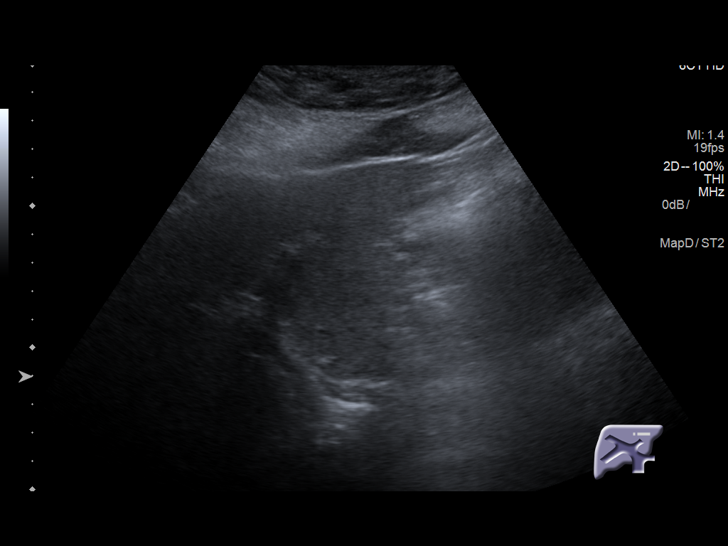
[im 37/60]
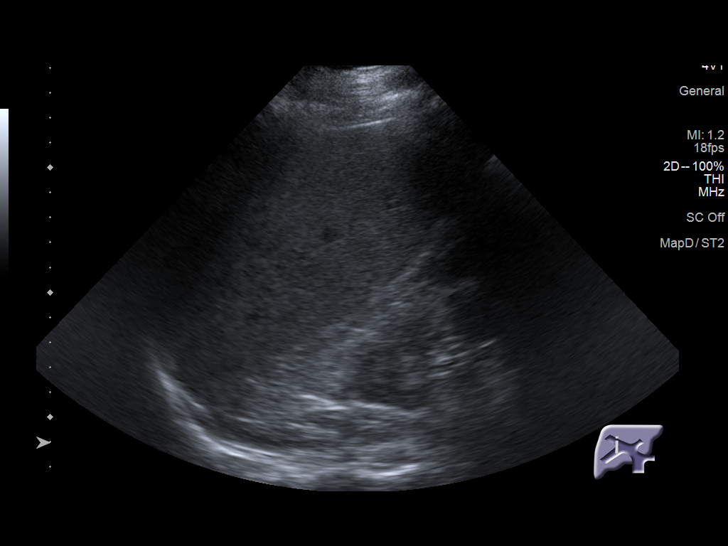
[im 40/60]
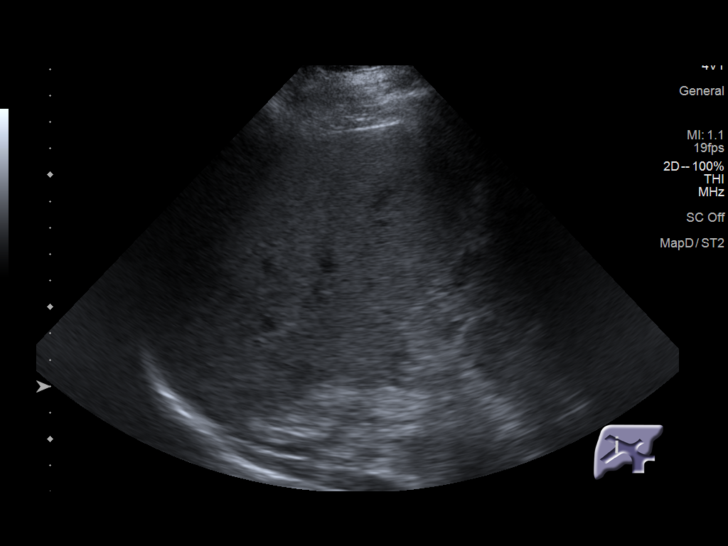
[im 45/60]
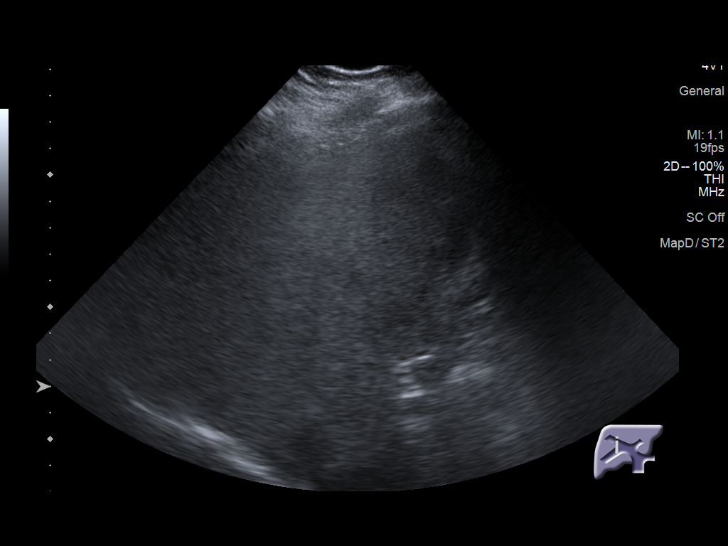
[im 50/60]
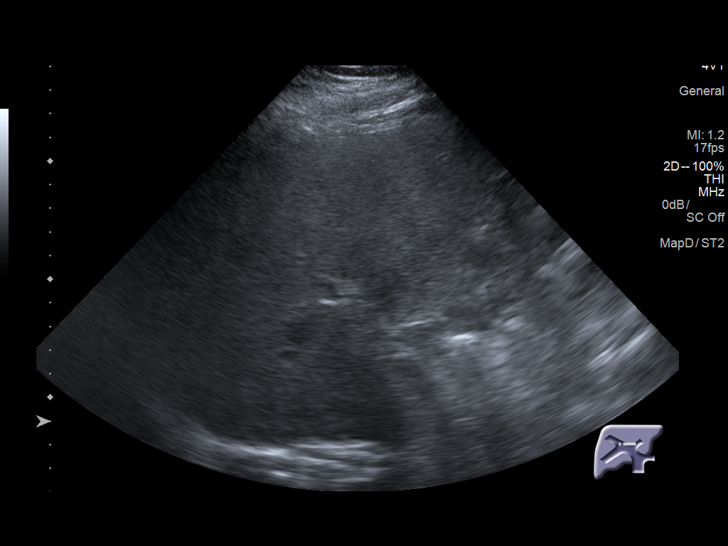
[im 55/60]
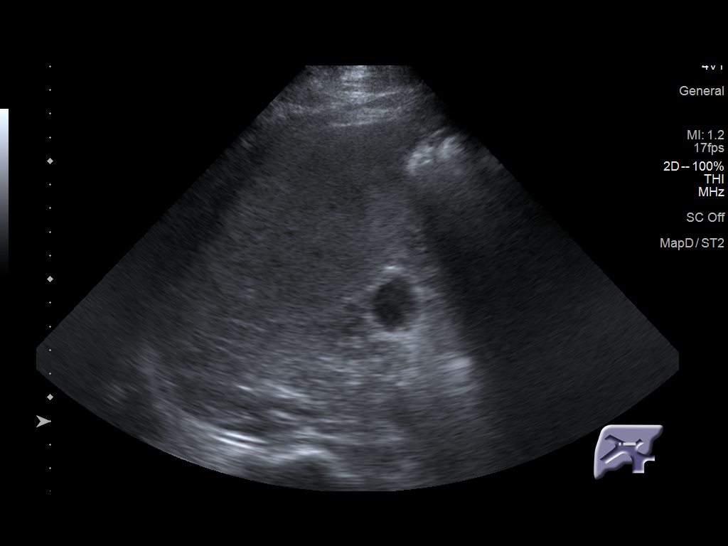
[im 60/60]
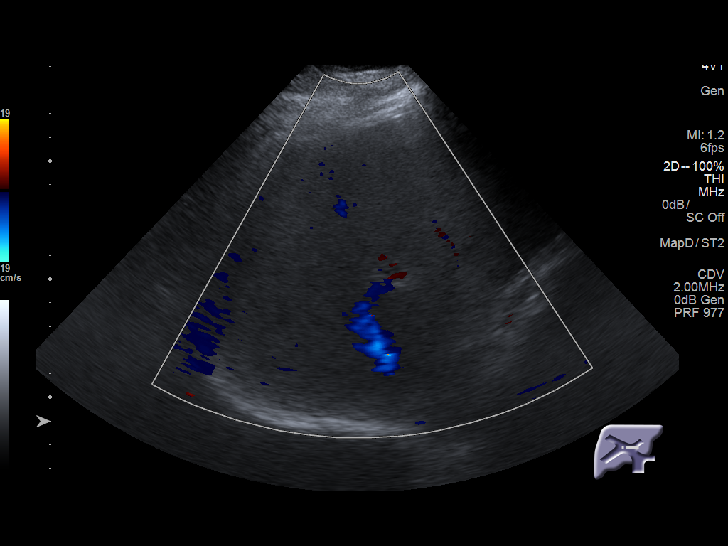

[14 of 25 positions shown; findings below may reference images not displayed]

FINDINGS: Gallbladder:

No gallstones or wall thickening visualized. No sonographic Murphy
sign noted by sonographer.

Common bile duct:

Diameter: 2.6 mm within normal limits

Liver:

No focal lesion identified. There is mild increased echogenicity of
the liver suspicious for fatty infiltration.
IMPRESSION: 1. No gallstones are noted within gallbladder. Normal CBD. No
thickening of gallbladder wall. Mild increased echogenicity of the
liver suspicious for fatty infiltration.

## 2019-03-13 ENCOUNTER — Other Ambulatory Visit: Payer: Self-pay

## 2019-03-13 ENCOUNTER — Emergency Department (HOSPITAL_COMMUNITY)
Admission: EM | Admit: 2019-03-13 | Discharge: 2019-03-13 | Disposition: A | Discharge status: Home / Self Care | Payer: BLUE CROSS/BLUE SHIELD | Attending: Emergency Medicine | Admitting: Emergency Medicine

## 2019-03-13 DIAGNOSIS — F17210 Nicotine dependence, cigarettes, uncomplicated: Secondary | ICD-10-CM | POA: Insufficient documentation

## 2019-03-13 DIAGNOSIS — Z79899 Other long term (current) drug therapy: Secondary | ICD-10-CM | POA: Insufficient documentation

## 2019-03-13 DIAGNOSIS — I10 Essential (primary) hypertension: Secondary | ICD-10-CM | POA: Insufficient documentation

## 2019-03-13 DIAGNOSIS — R1012 Left upper quadrant pain: Secondary | ICD-10-CM

## 2019-03-13 LAB — COMPREHENSIVE METABOLIC PANEL
ALT: 40 U/L (ref 0–44)
AST: 26 U/L (ref 15–41)
Albumin: 4.2 g/dL (ref 3.5–5.0)
Alkaline Phosphatase: 83 U/L (ref 38–126)
Anion gap: 10 (ref 5–15)
BUN: 13 mg/dL (ref 6–20)
CO2: 24 mmol/L (ref 22–32)
Calcium: 9 mg/dL (ref 8.9–10.3)
Chloride: 105 mmol/L (ref 98–111)
Creatinine, Ser: 0.9 mg/dL (ref 0.61–1.24)
GFR calc Af Amer: >60 mL/min (ref >60)
GFR calc non Af Amer: >60 mL/min (ref >60)
Glucose, Bld: 120 mg/dL — ABNORMAL HIGH (ref 70–99)
Potassium: 3.7 mmol/L (ref 3.5–5.1)
Sodium: 139 mmol/L (ref 135–145)
Total Bilirubin: 0.9 mg/dL (ref 0.3–1.2)
Total Protein: 7.3 g/dL (ref 6.5–8.1)

## 2019-03-13 LAB — CBC
HCT: 40.3 % (ref 39.0–52.0)
Hemoglobin: 13.3 g/dL (ref 13.0–17.0)
MCH: 28.3 pg (ref 26.0–34.0)
MCHC: 33 g/dL (ref 30.0–36.0)
MCV: 85.7 fL (ref 80.0–100.0)
Platelets: 371 10*3/uL (ref 150–400)
RBC: 4.7 MIL/uL (ref 4.22–5.81)
RDW: 12.5 % (ref 11.5–15.5)
WBC: 8.1 10*3/uL (ref 4.0–10.5)
nRBC: 0 % (ref 0.0–0.2)

## 2019-03-13 LAB — LIPASE, BLOOD: Lipase: 35 U/L (ref 11–51)

## 2019-03-13 MED ORDER — ONDANSETRON HCL 4 MG PO TABS: 4.0000 mg | ORAL_TABLET | Freq: Three times a day (TID) | ORAL | 0 refills | Status: DC | PRN

## 2019-03-13 MED ORDER — FENTANYL CITRATE (PF) 100 MCG/2ML IJ SOLN
50.0000 ug | Freq: Once | INTRAMUSCULAR | Status: AC
  Administered 2019-03-13: 05:00:00 50 ug via INTRAVENOUS
  Filled 2019-03-13: qty 2

## 2019-03-13 MED ORDER — SODIUM CHLORIDE 0.9 % IV BOLUS
1000.0000 mL | Freq: Once | INTRAVENOUS | Status: AC
  Administered 2019-03-13: 05:00:00 1000 mL via INTRAVENOUS

## 2019-03-13 MED ORDER — HYDROCODONE-ACETAMINOPHEN 5-325 MG PO TABS: 1.0000 | ORAL_TABLET | Freq: Four times a day (QID) | ORAL | 0 refills | Status: DC | PRN

## 2019-03-13 MED ORDER — SODIUM CHLORIDE 0.9 % IV BOLUS
1000.0000 mL | Freq: Once | INTRAVENOUS | Status: AC
  Administered 2019-03-13: 1000 mL via INTRAVENOUS

## 2019-03-13 MED ORDER — ONDANSETRON HCL 4 MG/2ML IJ SOLN
4.0000 mg | Freq: Once | INTRAMUSCULAR | Status: AC
  Administered 2019-03-13: 4 mg via INTRAVENOUS
  Filled 2019-03-13: qty 2

## 2019-03-13 NOTE — ED Triage Notes (Signed)
Pt c/o stomach aches/ pancreatitis. Started yesterday, said he had  some pizza and now he is in pain.

## 2019-03-13 NOTE — Discharge Instructions (Signed)
Drink plenty of fluids.  Avoid fried, spicy, or greasy foods or any of the things listed on the hiatal hernia information sheet.  Take the medication as prescribed.  Please follow-up with your gastroenterologist if you are not improving over the next 24 to 48 hours.

## 2019-03-13 NOTE — ED Provider Notes (Signed)
[facility] EMERGENCY DEPARTMENT Provider Note   CSN: [id number] Arrival date & time: 03/13/19  0034   Time seen 4:28 AM  History   Chief Complaint Chief Complaint  Patient presents with  . Abdominal Pain    HPI Jeff Buck is a 43 y.o. male.     HPI patient states he has a history of pancreatitis.  He states his pain started hurting on the morning of October 20 while he was at work.  He states it is in the left upper quadrant he puts 1 finger in the area.  He states he feels bloated.  He states that sharp and sometimes aching.  He states he has a known hiatal hernia but he denies any acid reflux symptoms.  He denies nausea, vomiting, or diarrhea.  He states the pain does radiate into his back and describes it as stabbing but I had to ask him that.  He states he ate pizza on the evening of the 21st about 5:30 PM but it did not make the pain worse.  PCP Heywood Bene, PA-C [facility] a woman in [place]   Past Medical History:  Diagnosis Date  . ADHD (attention deficit hyperactivity disorder)   . Anxiety   . Arthritis   . Complication of anesthesia yrs ago   woke up during knee surgery  . Hypertension   . Pancreatitis   . Sinusitis     Patient Active Problem List   Diagnosis Date Noted  . Hiatal hernia 06/16/2016  . Pancreatitis, acute 08/25/2015  . RUQ pain 08/25/2015  . Palpitations 04/06/2011  . Hypertension 04/06/2011  . Heartburn 04/06/2011    Past Surgical History:  Procedure Laterality Date  . CHOLECYSTECTOMY N/A 12/15/2015   Procedure: LAPAROSCOPIC CHOLECYSTECTOMY;  Surgeon: Vickie Epley, MD;  Location: [facility];  Service: General;  Laterality: N/A;  . EUS N/A 09/23/2015   Dr. Ardis Hughs: examined esophagus, stomach, and duodenum were normal. Pancreas normal. Question of microlithiasis as culprit   . ORTHOPEDIC SURGERY     left knee surgery        Home Medications    Prior to Admission medications   Medication Sig Start Date End Date  Taking? Authorizing Provider  ALPRAZolam Duanne Moron) 0.5 MG tablet Take 0.5 mg by mouth 2 (two) times daily as needed. 04/17/16   [provider]  amphetamine-dextroamphetamine (ADDERALL) 15 MG tablet Take 15 mg by mouth daily. 04/17/16 04/17/17  [provider]  B Complex Vitamins (B COMPLEX-B12 PO) Take 1 tablet by mouth daily.    [provider]  Ginkgo Biloba (GNP GINGKO BILOBA EXTRACT PO) Take 1 capsule by mouth daily.    [provider]  HYDROcodone-acetaminophen (NORCO/VICODIN) 5-325 MG tablet Take 1 tablet by mouth every 6 (six) hours as needed for moderate pain. 03/13/19   Rolland Porter, MD  lisinopril (PRINIVIL,ZESTRIL) 20 MG tablet Take 20 mg by mouth daily.     [provider]  Omega-3 Fatty Acids (FISH OIL OMEGA-3 PO) Take 1 capsule by mouth daily.    [provider]  ondansetron (ZOFRAN) 4 MG tablet Take 1 tablet (4 mg total) by mouth every 8 (eight) hours as needed for nausea or vomiting. 03/13/19   Rolland Porter, MD  oxyCODONE-acetaminophen (PERCOCET/ROXICET) 5-325 MG tablet Take 1 tablet by mouth every 6 (six) hours as needed for severe pain. 04/04/16   Horton, Barbette Hair, MD  PANAX GINSENG PO Take 1 capsule by mouth daily.    [provider]  pantoprazole (PROTONIX) 40 MG tablet Take 1 tablet (40 mg total) by mouth daily. 30 minutes before breakfast 06/16/16   Gelene MinkBoone, Anna W, NP  promethazine (PHENERGAN) 25 MG tablet Take 25 mg by mouth every 6 (six) hours as needed for nausea or vomiting.    [provider]  ranitidine (ZANTAC) 150 MG tablet Take 1 tablet (150 mg total) by mouth 2 (two) times daily. 12/26/17   Street, DavisMercedes, PA-C    Family History Family History  Problem Relation Age of Onset  . Heart failure Other   . Colon cancer Neg Hx     Social History Social History   Tobacco Use  . Smoking status: Current Some Day Smoker    Packs/day: 0.25    Years: 0.50    Pack years: 0.12    Types: Cigarettes  .  Smokeless tobacco: Former NeurosurgeonUser    Types: Snuff    Quit date: 04/16/2015  . Tobacco comment: pt instructed no to smoke day of surgery.  Substance Use Topics  . Alcohol use: No    Alcohol/week: 0.0 standard drinks  . Drug use: No     Allergies   Doxycycline and Hctz [hydrochlorothiazide]   Review of Systems Review of Systems  All other systems reviewed and are negative.    Physical Exam Updated Vital Signs BP 109/83   Pulse (!) 55   Temp 98.2 F (36.8 C) (Oral)   Resp 16   Ht 5' 10.5" (1.791 m)   Wt 106.6 kg   SpO2 99%   BMI 33.24 kg/m   Physical Exam Vitals signs and nursing note reviewed.  Constitutional:      General: He is not in acute distress.    Appearance: Normal appearance. He is well-developed. He is not ill-appearing or toxic-appearing.  HENT:     Head: Normocephalic and atraumatic.     Right Ear: External ear normal.     Left Ear: External ear normal.     Nose: Nose normal. No mucosal edema or rhinorrhea.     Mouth/Throat:     Mouth: Mucous membranes are dry.     Dentition: No dental abscesses.     Pharynx: No oropharyngeal exudate, posterior oropharyngeal erythema or uvula swelling.  Eyes:     Extraocular Movements: Extraocular movements intact.     Conjunctiva/sclera: Conjunctivae normal.     Pupils: Pupils are equal, round, and reactive to light.  Neck:     Musculoskeletal: Full passive range of motion without pain, normal range of motion and neck supple.  Cardiovascular:     Rate and Rhythm: Normal rate and regular rhythm.     Heart sounds: Normal heart sounds. No murmur. No friction rub. No gallop.   Pulmonary:     Effort: Pulmonary effort is normal. No respiratory distress.     Breath sounds: Normal breath sounds. No wheezing, rhonchi or rales.  Chest:     Chest wall: No tenderness or crepitus.  Abdominal:     General: Bowel sounds are normal. There is distension.     Palpations: Abdomen is soft.     Tenderness: There is abdominal  tenderness. There is no guarding or rebound.    Musculoskeletal: Normal range of motion.        General: No tenderness or deformity.     Comments: Moves all extremities well.   Skin:    General: Skin is warm and dry.     Coloration: Skin is not pale.     Findings: No  erythema or rash.  Neurological:     General: No focal deficit present.     Mental Status: He is alert and oriented to person, place, and time.     Cranial Nerves: No cranial nerve deficit.  Psychiatric:        Mood and Affect: Mood normal. Mood is not anxious.        Speech: Speech normal.        Behavior: Behavior normal.        Thought Content: Thought content normal.      ED Treatments / Results  Labs (all labs ordered are listed, but only abnormal results are displayed) Results for orders placed or performed during the hospital encounter of 03/13/19  Lipase, blood  Result Value Ref Range   Lipase 35 11 - 51 U/L  Comprehensive metabolic panel  Result Value Ref Range   Sodium 139 135 - 145 mmol/L   Potassium 3.7 3.5 - 5.1 mmol/L   Chloride 105 98 - 111 mmol/L   CO2 24 22 - 32 mmol/L   Glucose, Bld 120 (H) 70 - 99 mg/dL   BUN 13 6 - 20 mg/dL   Creatinine, Ser 6.81 0.61 - 1.24 mg/dL   Calcium 9.0 8.9 - 27.5 mg/dL   Total Protein 7.3 6.5 - 8.1 g/dL   Albumin 4.2 3.5 - 5.0 g/dL   AST 26 15 - 41 U/L   ALT 40 0 - 44 U/L   Alkaline Phosphatase 83 38 - 126 U/L   Total Bilirubin 0.9 0.3 - 1.2 mg/dL   GFR calc non Af Amer >60 >60 mL/min   GFR calc Af Amer >60 >60 mL/min   Anion gap 10 5 - 15  CBC  Result Value Ref Range   WBC 8.1 4.0 - 10.5 K/uL   RBC 4.70 4.22 - 5.81 MIL/uL   Hemoglobin 13.3 13.0 - 17.0 g/dL   HCT 17.0 01.7 - 49.4 %   MCV 85.7 80.0 - 100.0 fL   MCH 28.3 26.0 - 34.0 pg   MCHC 33.0 30.0 - 36.0 g/dL   RDW 49.6 75.9 - 16.3 %   Platelets 371 150 - 400 K/uL   nRBC 0.0 0.0 - 0.2 %   Laboratory interpretation all normal except nonfasting hyperglycemia    EKG None  Radiology No  results found.  Procedures Procedures (including critical care time)  Medications Ordered in ED Medications  sodium chloride 0.9 % bolus 1,000 mL (0 mLs Intravenous Stopped 03/13/19 0650)  sodium chloride 0.9 % bolus 1,000 mL (0 mLs Intravenous Stopped 03/13/19 0650)  fentaNYL (SUBLIMAZE) injection 50 mcg (50 mcg Intravenous Given 03/13/19 0453)  ondansetron (ZOFRAN) injection 4 mg (4 mg Intravenous Given 03/13/19 0453)     Initial Impression / Assessment and Plan / ED Course  I have reviewed the triage vital signs and the nursing notes.  Pertinent labs & imaging results that were available during my care of the patient were reviewed by me and considered in my medical decision making (see chart for details).    Patient appeared dehydrated.  He was given IV fluids and IV Zofran and fentanyl.  Recheck at 6:30 AM patient states he is feeling much better.  He still has some discomfort but not nearly as bad as it was.  He feels ready to be discharged.    My look at his record he had a CT of the abdomen pelvis done August 2019 that was without acute changes, December 2017 showing a small  hiatal hernia, March 2017 showing pancreatitis, October 2015 that is negative, January 2014 - for pancreatitis, December 2013 + pancreatitis.  He also had a CT scan of his abdomen/pelvis done at [facility] in 2014 that was negative for pancreatitis.  Final Clinical Impressions(s) / ED Diagnoses   Final diagnoses:  Left upper quadrant abdominal pain    ED Discharge Orders         Ordered    HYDROcodone-acetaminophen (NORCO/VICODIN) 5-325 MG tablet  Every 6 hours PRN     03/13/19 0650    ondansetron (ZOFRAN) 4 MG tablet  Every 8 hours PRN     03/13/19 0650         Plan discharge  Devoria Albe, MD, Concha Pyo, MD 03/13/19 [phone]

## 2020-02-24 ENCOUNTER — Emergency Department (HOSPITAL_COMMUNITY)
Admission: EM | Admit: 2020-02-24 | Discharge: 2020-02-24 | Disposition: A | Discharge status: Home / Self Care | Payer: BC Managed Care – PPO | Attending: Emergency Medicine | Admitting: Emergency Medicine

## 2020-02-24 ENCOUNTER — Encounter (HOSPITAL_COMMUNITY): Payer: Self-pay

## 2020-02-24 ENCOUNTER — Emergency Department (HOSPITAL_COMMUNITY): Payer: BC Managed Care – PPO

## 2020-02-24 ENCOUNTER — Other Ambulatory Visit: Payer: Self-pay

## 2020-02-24 DIAGNOSIS — Z79899 Other long term (current) drug therapy: Secondary | ICD-10-CM | POA: Diagnosis not present

## 2020-02-24 DIAGNOSIS — R200 Anesthesia of skin: Secondary | ICD-10-CM

## 2020-02-24 DIAGNOSIS — R202 Paresthesia of skin: Secondary | ICD-10-CM | POA: Diagnosis not present

## 2020-02-24 DIAGNOSIS — M542 Cervicalgia: Secondary | ICD-10-CM | POA: Diagnosis present

## 2020-02-24 DIAGNOSIS — I10 Essential (primary) hypertension: Secondary | ICD-10-CM | POA: Insufficient documentation

## 2020-02-24 DIAGNOSIS — E119 Type 2 diabetes mellitus without complications: Secondary | ICD-10-CM | POA: Diagnosis not present

## 2020-02-24 DIAGNOSIS — F17210 Nicotine dependence, cigarettes, uncomplicated: Secondary | ICD-10-CM | POA: Insufficient documentation

## 2020-02-24 HISTORY — DX: Type 2 diabetes mellitus without complications: E11.9

## 2020-02-24 LAB — I-STAT CHEM 8, ED
BUN: 18 mg/dL (ref 6–20)
Calcium, Ion: 1.2 mmol/L (ref 1.15–1.40)
Chloride: 104 mmol/L (ref 98–111)
Creatinine, Ser: 1 mg/dL (ref 0.61–1.24)
Glucose, Bld: 135 mg/dL — ABNORMAL HIGH (ref 70–99)
HCT: 40 % (ref 39.0–52.0)
Hemoglobin: 13.6 g/dL (ref 13.0–17.0)
Potassium: 4 mmol/L (ref 3.5–5.1)
Sodium: 140 mmol/L (ref 135–145)
TCO2: 23 mmol/L (ref 22–32)

## 2020-02-24 MED ORDER — DICLOFENAC EPOLAMINE 1.3 % EX PTCH
1.0000 | MEDICATED_PATCH | Freq: Once | CUTANEOUS | Status: DC
  Administered 2020-02-24: 1 via TRANSDERMAL
  Filled 2020-02-24: qty 1

## 2020-02-24 MED ORDER — PREDNISONE 20 MG PO TABS: 40.0000 mg | ORAL_TABLET | Freq: Every day | ORAL | 0 refills | Status: DC

## 2020-02-24 MED ORDER — PREDNISONE 50 MG PO TABS
60.0000 mg | ORAL_TABLET | Freq: Once | ORAL | Status: AC
  Administered 2020-02-24: 60 mg via ORAL
  Filled 2020-02-24: qty 1

## 2020-02-24 MED ORDER — METHYL SALICYLATE-LIDO-MENTHOL 4-4-5 % EX PTCH: 1.0000 | MEDICATED_PATCH | Freq: Two times a day (BID) | CUTANEOUS | 0 refills | Status: DC

## 2020-02-24 NOTE — ED Provider Notes (Signed)
[facility] EMERGENCY DEPARTMENT Provider Note   CSN: [id number] Arrival date & time: 02/24/20  4010     History Chief Complaint  Patient presents with  . Neck Pain    Jeff Buck is a 44 y.o. male.  HPI  Patient presents concern of neck pain, hand numbness. Hand numbness has been present for weeks, possibly months, and he is awaiting neurology outpatient visit in 2 months. Today he awoke with new stinging discomfort in his neck, throughout, posteriorly. No fall, no recent trauma, no injuries, no history of such. No medication taken for relief, but given the comminution of hand numbness and neck discomfort he presents for evaluation.   Past Medical History:  Diagnosis Date  . ADHD (attention deficit hyperactivity disorder)   . Anxiety   . Arthritis   . Complication of anesthesia yrs ago   woke up during knee surgery  . Diabetes mellitus without complication (HCC)   . Hypertension   . Pancreatitis   . Sinusitis     Patient Active Problem List   Diagnosis Date Noted  . Hiatal hernia 06/16/2016  . Pancreatitis, acute 08/25/2015  . RUQ pain 08/25/2015  . Palpitations 04/06/2011  . Hypertension 04/06/2011  . Heartburn 04/06/2011    Past Surgical History:  Procedure Laterality Date  . CHOLECYSTECTOMY N/A 12/15/2015   Procedure: LAPAROSCOPIC CHOLECYSTECTOMY;  Surgeon: [name], MD;  Location: [facility];  Service: General;  Laterality: N/A;  . EUS N/A 09/23/2015   Dr. Christella Hartigan: examined esophagus, stomach, and duodenum were normal. Pancreas normal. Question of microlithiasis as culprit   . ORTHOPEDIC SURGERY     left knee surgery       Family History  Problem Relation Age of Onset  . Heart failure Other   . Colon cancer Neg Hx     Social History   Tobacco Use  . Smoking status: Current Some Day Smoker    Packs/day: 0.25    Years: 0.50    Pack years: 0.12    Types: Cigarettes  . Smokeless tobacco: Former Neurosurgeon    Types: Snuff    Quit date:  04/16/2015  . Tobacco comment: pt instructed no to smoke day of surgery.  Substance Use Topics  . Alcohol use: No    Alcohol/week: 0.0 standard drinks  . Drug use: No    Home Medications Prior to Admission medications   Medication Sig Start Date End Date Taking? Authorizing Provider  ALPRAZolam Prudy Feeler) 0.5 MG tablet Take 0.5 mg by mouth 2 (two) times daily as needed. 04/17/16   [provider]  amphetamine-dextroamphetamine (ADDERALL) 15 MG tablet Take 15 mg by mouth daily. 04/17/16 04/17/17  [provider]  B Complex Vitamins (B COMPLEX-B12 PO) Take 1 tablet by mouth daily.    [provider]  Ginkgo Biloba (GNP GINGKO BILOBA EXTRACT PO) Take 1 capsule by mouth daily.    [provider]  HYDROcodone-acetaminophen (NORCO/VICODIN) 5-325 MG tablet Take 1 tablet by mouth every 6 (six) hours as needed for moderate pain. 03/13/19   Devoria Albe, MD  lisinopril (PRINIVIL,ZESTRIL) 20 MG tablet Take 20 mg by mouth daily.     [provider]  Methyl Salicylate-Lido-Menthol 4-4-5 % PTCH Apply 1 patch topically in the morning and at bedtime. 02/24/20   Gerhard Munch, MD  Omega-3 Fatty Acids (FISH OIL OMEGA-3 PO) Take 1 capsule by mouth daily.    [provider]  ondansetron (ZOFRAN) 4 MG tablet Take 1 tablet (4 mg total) by mouth  every 8 (eight) hours as needed for nausea or vomiting. 03/13/19   Devoria Albe, MD  oxyCODONE-acetaminophen (PERCOCET/ROXICET) 5-325 MG tablet Take 1 tablet by mouth every 6 (six) hours as needed for severe pain. 04/04/16   Horton, Mayer Masker, MD  PANAX GINSENG PO Take 1 capsule by mouth daily.    [provider]  pantoprazole (PROTONIX) 40 MG tablet Take 1 tablet (40 mg total) by mouth daily. 30 minutes before breakfast 06/16/16   Gelene Mink, NP  predniSONE (DELTASONE) 20 MG tablet Take 2 tablets (40 mg total) by mouth daily with breakfast. For the next four days 02/24/20   Gerhard Munch, MD  promethazine  (PHENERGAN) 25 MG tablet Take 25 mg by mouth every 6 (six) hours as needed for nausea or vomiting.    [provider]  ranitidine (ZANTAC) 150 MG tablet Take 1 tablet (150 mg total) by mouth 2 (two) times daily. 12/26/17   Street, [place], PA-C    Allergies    Doxycycline and Hctz [hydrochlorothiazide]  Review of Systems   Review of Systems  Constitutional:       Per HPI, otherwise negative  HENT:       Per HPI, otherwise negative  Respiratory:       Per HPI, otherwise negative  Cardiovascular:       Per HPI, otherwise negative  Gastrointestinal: Negative for vomiting.  Endocrine:       Negative aside from HPI  Genitourinary:       Neg aside from HPI   Musculoskeletal:       Per HPI, otherwise negative  Skin: Negative.   Neurological: Positive for numbness. Negative for syncope.    Physical Exam Updated Vital Signs BP 130/62 (BP Location: Left Arm)   Pulse 69   Temp 98.1 F (36.7 C) (Oral)   Resp 18   Ht 5\' 11"  (1.803 m)   Wt 106.6 kg   SpO2 98%   BMI 32.78 kg/m   Physical Exam Vitals and nursing note reviewed.  Constitutional:      General: He is not in acute distress.    Appearance: He is well-developed.  HENT:     Head: Normocephalic and atraumatic.  Eyes:     Conjunctiva/sclera: Conjunctivae normal.  Neck:   Cardiovascular:     Rate and Rhythm: Normal rate and regular rhythm.  Pulmonary:     Effort: Pulmonary effort is normal. No respiratory distress.     Breath sounds: No stridor.  Abdominal:     General: There is no distension.  Skin:    General: Skin is warm and dry.  Neurological:     Mental Status: He is alert and oriented to person, place, and time.     Cranial Nerves: No cranial nerve deficit, dysarthria or facial asymmetry.     Comments: No focal deficit, patient moves all extremities appropriately, has bilateral hand motion that is unremarkable, sensation similarly unremarkable, though with noted subjective numbness bilaterally.      ED Results / Procedures / Treatments   Labs (all labs ordered are listed, but only abnormal results are displayed) Labs Reviewed  I-STAT CHEM 8, ED - Abnormal; Notable for the following components:      Result Value   Glucose, Bld 135 (*)    All other components within normal limits    EKG None  Radiology DG Cervical Spine Complete  Result Date: 02/24/2020 CLINICAL DATA:  LEFT-sided neck pain without known injury EXAM: CERVICAL SPINE - COMPLETE 4+  VIEW COMPARISON:  January 13, 2014 FINDINGS: The cervical spine is visualized from C1-superior endplate of T1. Cervical alignment is maintained. Vertebral body heights are maintained: no evidence of acute fracture. Intervertebral spaces are maintained without significant degenerative changes. Mild osseous neuroforaminal narrowing at bilateral C4-5 and LEFT C3-4 secondary to uncovertebral hypertrophy. No prevertebral soft tissue swelling. Visualized thorax is unremarkable. IMPRESSION: 1. Mild osseous neuroforaminal narrowing at bilateral C4-5 and LEFT C3-4 secondary to uncovertebral hypertrophy. 2. No acute fracture or malalignment. Electronically Signed   By: Meda Klinefelter MD   On: 02/24/2020 09:19    Procedures Procedures (including critical care time)  Medications Ordered in ED Medications  diclofenac (FLECTOR) 1.3 % 1 patch (1 patch Transdermal Patch Applied 02/24/20 0914)  predniSONE (DELTASONE) tablet 60 mg (60 mg Oral Given 02/24/20 0934)    ED Course  I have reviewed the triage vital signs and the nursing notes.  Pertinent labs & imaging results that were available during my care of the patient were reviewed by me and considered in my medical decision making (see chart for details).  9:50 AM Patient in no distress, is awake, alert, speaking clearly. Reviewed the x-ray images, discussed with him, we discussed blood work as well. Patient notes some vitamin intake this morning, which may be contributing to his  hyperglycemia, but will follow up on that with his physician. In regards to the patient's numbness, neck pain, patient found to have bilateral foraminal narrowing, some suspicion for this contributing to numbness/radiculopathy. Patient has received diclofenac patch, will start steroids, will follow up with our local neurologist.  Final Clinical Impression(s) / ED Diagnoses Final diagnoses:  Neck pain  Numbness    Rx / DC Orders ED Discharge Orders         Ordered    predniSONE (DELTASONE) 20 MG tablet  Daily with breakfast        02/24/20 0925    Methyl Salicylate-Lido-Menthol 4-4-5 % PTCH  2 times daily        02/24/20 0925           Gerhard Munch, MD 02/24/20 [phone]

## 2020-02-24 NOTE — ED Triage Notes (Signed)
Pt presents to ED with complaints of neck pain since 0200. Pt states if he moves a certain way it feels like something is stinging him. Pt also reports bilateral hand tingling but states that has been ongoing for a few months and he is suppose to see Neuro on 04/22/20

## 2020-02-24 NOTE — Discharge Instructions (Signed)
As discussed, today's evaluation has been generally reassuring. There is, however, evidence that there is some narrowing of the areas in your neck through which nerves pass.  These nerves provide sensation from your hands.  This may be contributing to your numbness. Please take all medication as directed, and be sure to follow-up with your primary care physician and our neurologist.  Return here for concerning changes in your condition.

## 2020-03-03 ENCOUNTER — Encounter: Payer: Self-pay | Admitting: *Deleted

## 2020-03-04 ENCOUNTER — Encounter: Payer: Self-pay | Admitting: *Deleted

## 2020-03-05 ENCOUNTER — Encounter: Payer: Self-pay | Admitting: Neurology

## 2020-03-05 ENCOUNTER — Ambulatory Visit: Payer: BC Managed Care – PPO | Admitting: Neurology

## 2020-03-05 ENCOUNTER — Other Ambulatory Visit: Payer: Self-pay

## 2020-03-05 ENCOUNTER — Encounter: Payer: Self-pay | Admitting: *Deleted

## 2020-03-05 VITALS — BP 124/81 | HR 82 | Ht 71.0 in | Wt 257.0 lb

## 2020-03-05 DIAGNOSIS — R202 Paresthesia of skin: Secondary | ICD-10-CM | POA: Diagnosis not present

## 2020-03-05 DIAGNOSIS — M542 Cervicalgia: Secondary | ICD-10-CM | POA: Diagnosis not present

## 2020-03-05 MED ORDER — GABAPENTIN 300 MG PO CAPS: 300.0000 mg | ORAL_CAPSULE | Freq: Three times a day (TID) | ORAL | 3 refills | Status: DC

## 2020-03-05 NOTE — Progress Notes (Signed)
Chief Complaint  Patient presents with  . New Patient (Initial Visit)    Referred for bilateral numbness and tingling in hands. He also has neck pain. Recently had abnormal x-ray of neck.   . PCP    Roger Kill, PA-C  . Emergency MD    Salley Scarlet, MD - referring provider    HISTORICAL  ERSKINE STEINFELDT is a 44 year old male, seen in request by emergency physician Dr. Salley Scarlet, and his primary care PA Rueben Bash for evaluation of neck pain, bilateral hands numbness, initial evaluation was on March 05, 2020  I reviewed and summarized the referring note. PMHX DM,  HTN ADHD Pancreatitis. Hiatal hernia.  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, was not able to continue his job since early October 2021 due to intermittent bilateral hands numbness tingling, his drop require moving heavy object  Symptoms started around 3 months ago in July, he began to have intermittent bilateral hands numbness tingling, sometimes woke up from sleep feel all 5 fingers goes numb, he has to shake his hands to make the sensation comes back,  He also has intermittent neck burning pain, denies radiating pain to bilateral shoulder, denies persistent bilateral hands numbness  February 24, 2020, he woke up felt significant burning pain in the middle of his neck, presented to emergency room, We personally reviewed x-ray of cervical spine 1. Mild osseous neuroforaminal narrowing at bilateral C4-5 and LEFT C3-4 secondary to uncovertebral hypertrophy. 2. No acute fracture or malalignment.  He denies persistent sensory loss of bilateral hands, denies bilateral hands weakness, denies gait abnormality,  REVIEW OF SYSTEMS: Full 14 system review of systems performed and notable only for as above All other review of systems were negative.  ALLERGIES: Allergies  Allergen Reactions  . Doxycycline Swelling  . Hctz [Hydrochlorothiazide] Swelling    HOME MEDICATIONS: Current Outpatient  Medications  Medication Sig Dispense Refill  . ALPRAZolam (XANAX) 1 MG tablet Take 1 mg by mouth at bedtime as needed.    Marland Kitchen amphetamine-dextroamphetamine (ADDERALL) 20 MG tablet Take 20 mg by mouth daily.    . B Complex Vitamins (B COMPLEX-B12 PO) Take 1 tablet by mouth daily.    . cyclobenzaprine (FLEXERIL) 10 MG tablet Take 10 mg by mouth daily as needed.    . Ginkgo Biloba Extract 60 MG CAPS Take 1 capsule by mouth.    Marland Kitchen glipiZIDE (GLUCOTROL XL) 5 MG 24 hr tablet Take 5 mg by mouth.    Marland Kitchen KRILL OIL PO Take 1 tablet by mouth daily.    Marland Kitchen lisinopril (PRINIVIL,ZESTRIL) 20 MG tablet Take 20 mg by mouth daily.     . Multiple Vitamin (MULTIVITAMIN) capsule Take 1 capsule by mouth daily.    . Omega-3 Fatty Acids (FISH OIL OMEGA-3 PO) Take 1 capsule by mouth daily.    . pantoprazole (PROTONIX) 40 MG tablet Take 1 tablet (40 mg total) by mouth daily. 30 minutes before breakfast 30 tablet 3   No current facility-administered medications for this visit.    PAST MEDICAL HISTORY: Past Medical History:  Diagnosis Date  . ADHD (attention deficit hyperactivity disorder)   . Anxiety   . Arthritis   . Complication of anesthesia yrs ago   woke up during knee surgery  . Diabetes mellitus without complication (HCC)   . GERD (gastroesophageal reflux disease)   . Hypertension   . Numbness and tingling   . Pancreatitis   . Sinusitis     PAST SURGICAL HISTORY: Past  Surgical History:  Procedure Laterality Date  . CHOLECYSTECTOMY N/A 12/15/2015   Procedure: LAPAROSCOPIC CHOLECYSTECTOMY;  Surgeon: [name], MD;  Location: [facility];  Service: General;  Laterality: N/A;  . EUS N/A 09/23/2015   Dr. Christella Hartigan: examined esophagus, stomach, and duodenum were normal. Pancreas normal. Question of microlithiasis as culprit   . ORTHOPEDIC SURGERY     left knee surgery    FAMILY HISTORY: Family History  Problem Relation Age of Onset  . Heart failure Other   . Diabetes Mother   . Diabetes Father   . Colon  cancer Neg Hx     SOCIAL HISTORY: Social History   Socioeconomic History  . Marital status: Legally Separated    Spouse name: Not on file  . Number of children: 3  . Years of education: 11th grade  . Highest education level: Not on file  Occupational History  . Occupation: Nurse, children's  Tobacco Use  . Smoking status: Former Smoker    Packs/day: 0.25    Years: 0.50    Pack years: 0.12    Types: Cigarettes  . Smokeless tobacco: Current User    Types: Chew  Substance and Sexual Activity  . Alcohol use: No    Alcohol/week: 0.0 standard drinks  . Drug use: No  . Sexual activity: Not Currently  Other Topics Concern  . Not on file  Social History Narrative   Lives at home with one of his sons.   Right-handed.   Caffeine: 3 sodas per day.   Social Determinants of Health   Financial Resource Strain:   . Difficulty of Paying Living Expenses: Not on file  Food Insecurity:   . Worried About Programme researcher, broadcasting/film/video in the Last Year: Not on file  . Ran Out of Food in the Last Year: Not on file  Transportation Needs:   . Lack of Transportation (Medical): Not on file  . Lack of Transportation (Non-Medical): Not on file  Physical Activity:   . Days of Exercise per Week: Not on file  . Minutes of Exercise per Session: Not on file  Stress:   . Feeling of Stress : Not on file  Social Connections:   . Frequency of Communication with Friends and Family: Not on file  . Frequency of Social Gatherings with Friends and Family: Not on file  . Attends Religious Services: Not on file  . Active Member of Clubs or Organizations: Not on file  . Attends Banker Meetings: Not on file  . Marital Status: Not on file  Intimate Partner Violence:   . Fear of Current or Ex-Partner: Not on file  . Emotionally Abused: Not on file  . Physically Abused: Not on file  . Sexually Abused: Not on file     PHYSICAL EXAM   Vitals:   03/05/20 0859  BP: 124/81  Pulse: 82  Weight: 257 lb  (116.6 kg)  Height: 5\' 11"  (1.803 m)   Not recorded     Body mass index is 35.84 kg/m.  PHYSICAL EXAMNIATION:  Gen: NAD, conversant, well nourised, well groomed                     Cardiovascular: Regular rate rhythm, no peripheral edema, warm, nontender. Eyes: Conjunctivae clear without exudates or hemorrhage Neck: Supple, no carotid bruits. Pulmonary: Clear to auscultation bilaterally   NEUROLOGICAL EXAM:  MENTAL STATUS: Speech:    Speech is normal; fluent and spontaneous with normal comprehension.  Cognition:  Orientation to time, place and person     Normal recent and remote memory     Normal Attention span and concentration     Normal Language, naming, repeating,spontaneous speech     Fund of knowledge   CRANIAL NERVES: CN II: Visual fields are full to confrontation. Pupils are round equal and briskly reactive to light. CN III, IV, VI: extraocular movement are normal. No ptosis. CN V: Facial sensation is intact to light touch CN VII: Face is symmetric with normal eye closure  CN VIII: Hearing is normal to causal conversation. CN IX, X: Phonation is normal. CN XI: Head turning and shoulder shrug are intact  MOTOR: There is no pronator drift of out-stretched arms. Muscle bulk and tone are normal. Muscle strength is normal.  REFLEXES: Reflexes are 2+ and symmetric at the biceps, triceps, knees, and ankles. Plantar responses are flexor.  SENSORY: Intact to light touch, pinprick and vibratory sensation are intact in fingers and toes.  COORDINATION: There is no trunk or limb dysmetria noted.  GAIT/STANCE: Posture is normal. Gait is steady with normal steps, base, arm swing, and turning. Heel and toe walking are normal. Tandem gait is normal.  Romberg is absent.   DIAGNOSTIC DATA (LABS, IMAGING, TESTING) - I reviewed patient records, labs, notes, testing and imaging myself where available.   ASSESSMENT AND PLAN  JAHZIAH SIMONIN is a 44 y.o. male     Bilateral hands paresthesia Neck pain  Differentiation diagnosis include carpal tunnel syndrome, cervical radiculopathy, versus musculoskeletal etiology  Proceed with MRI of cervical spine  EMG nerve conduction study  Gabapentin 300 mg 3 times daily as needed    Levert Feinstein, M.D. Ph.D.  [facility] [address] [place], [place] [address] Ph: [phone] Fax: [phone]  CC:  Salley Scarlet, MD [id number] [place],  [place] [address]  Roger Kill, PA-C

## 2020-03-05 NOTE — Patient Instructions (Signed)
Voltaren Arthritis Pain Gel for Topical Arthritis Pain Relief, 3.5 oz/100 g Tubes (Pack of 2)

## 2020-03-08 ENCOUNTER — Ambulatory Visit: Payer: BC Managed Care – PPO | Admitting: Neurology

## 2020-03-09 ENCOUNTER — Telehealth: Payer: Self-pay | Admitting: Neurology

## 2020-03-09 NOTE — Telephone Encounter (Signed)
[employer] Auth: [id number] (exp. 03/09/20 to 04/07/20) order sent to [facility] he is scheduled for 03/11/20

## 2020-03-10 ENCOUNTER — Ambulatory Visit (INDEPENDENT_AMBULATORY_CARE_PROVIDER_SITE_OTHER): Payer: BC Managed Care – PPO | Admitting: Neurology

## 2020-03-10 ENCOUNTER — Encounter: Payer: Self-pay | Admitting: Neurology

## 2020-03-10 ENCOUNTER — Other Ambulatory Visit: Payer: Self-pay

## 2020-03-10 ENCOUNTER — Ambulatory Visit: Payer: BC Managed Care – PPO | Admitting: Neurology

## 2020-03-10 DIAGNOSIS — Z0289 Encounter for other administrative examinations: Secondary | ICD-10-CM

## 2020-03-10 DIAGNOSIS — G5603 Carpal tunnel syndrome, bilateral upper limbs: Secondary | ICD-10-CM | POA: Diagnosis not present

## 2020-03-10 DIAGNOSIS — M542 Cervicalgia: Secondary | ICD-10-CM

## 2020-03-10 DIAGNOSIS — R202 Paresthesia of skin: Secondary | ICD-10-CM

## 2020-03-10 NOTE — Procedures (Signed)
Full Name: Jeff Buck Gender: Male MRN #: [id number] Date of Birth: 02-20-1976    Visit Date: 03/10/2020 08:30 Age: 44 Years Examining Physician: Levert Feinstein, MD  Referring Physician: Levert Feinstein, MD Height: 5 feet 11 inch History: 44 year old male, with history of bilateral hands paresthesia  Summary of the test: Nerve conduction study: Right ulnar sensory and motor responses were normal  Bilateral median sensory responses showed moderately prolonged peak latency with significantly decreased snap amplitude Bilateral median motor responses showed moderately prolonged peak latency, with normal CMAP amplitude, conduction velocity  Electromyography: Selected needle examination of right upper extremity muscles, right cervical paraspinal muscles were normal.  Left abductor pollicis brevis was normal   Conclusion: This is an abnormal study.  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  There is no evidence of right cervical radiculopathy.    ------------------------------- Levert Feinstein, M.D. PhD.  [facility] [address], [address] [place], [place] [address] Tel: [phone] Fax: [phone]  Verbal informed consent was obtained from the patient, patient was informed of potential risk of procedure, including bruising, bleeding, hematoma formation, infection, muscle weakness, muscle pain, numbness, among others.        MNC    Nerve / Sites Muscle Latency Ref. Amplitude Ref. Rel Amp Segments Distance Velocity Ref. Area    ms ms mV mV %  cm m/s m/s mVms  R Median - APB     Wrist APB 5.8 ?4.4 4.2 ?4.0 100 Wrist - APB 7   21.0     Upper arm APB 10.3  3.0  72.3 Upper arm - Wrist 24 53 ?49 13.0  L Median - APB     Wrist APB 6.2 ?4.4 4.3 ?4.0 100 Wrist - APB 7   24.3     Upper arm APB 10.7  5.7  133 Upper arm - Wrist 24 54 ?49 27.4  R Ulnar - ADM      Wrist ADM 2.6 ?3.3 11.5 ?6.0 100 Wrist - ADM 7   28.5     B.Elbow ADM 6.3  10.9  94.6 B.Elbow - Wrist 24 66 ?49 28.0     A.Elbow ADM 7.8  10.9  100 A.Elbow - B.Elbow 10 66 ?49 28.5           SNC    Nerve / Sites Rec. Site Peak Lat Ref.  Amp Ref. Segments Distance    ms ms V V  cm  R Median - Orthodromic (Dig II, Mid palm)     Dig II Wrist 4.2 ?3.4 3 ?10 Dig II - Wrist 13  L Median - Orthodromic (Dig II, Mid palm)     Dig II Wrist 4.2 ?3.4 2 ?10 Dig II - Wrist 13  R Ulnar - Orthodromic, (Dig V, Mid palm)     Dig V Wrist 2.5 ?3.1 5 ?5 Dig V - Wrist 31           F  Wave    Nerve F Lat Ref.   ms ms  R Ulnar - ADM 28.6 ?32.0       EMG Summary Table    Spontaneous MUAP Recruitment  Muscle IA Fib PSW Fasc Other Amp Dur. Poly Pattern  R. First dorsal interosseous Normal None None None _______ Normal Normal Normal Normal  R. Abductor pollicis brevis Normal None None None _______ Normal Normal Normal Normal  Right cervical paraspinal muscles  normal  none  none  none ------  normal  normal  normal  normal  R. Pronator teres Normal None None None _______ Normal Normal Normal Normal  R. Biceps brachii Normal None None None _______ Normal Normal Normal Normal  R. Deltoid Normal None None None _______ Normal Normal Normal Normal  R. Triceps brachii Normal None None None _______ Normal Normal Normal Normal  L. Abductor pollicis brevis Normal None None None _______ Normal Normal Normal Normal

## 2020-04-22 ENCOUNTER — Ambulatory Visit: Payer: BC Managed Care – PPO | Admitting: Neurology

## 2020-09-18 ENCOUNTER — Observation Stay (HOSPITAL_COMMUNITY): Payer: Self-pay

## 2020-09-18 ENCOUNTER — Other Ambulatory Visit: Payer: Self-pay

## 2020-09-18 ENCOUNTER — Observation Stay (HOSPITAL_BASED_OUTPATIENT_CLINIC_OR_DEPARTMENT_OTHER): Payer: Self-pay

## 2020-09-18 ENCOUNTER — Encounter (HOSPITAL_COMMUNITY): Payer: Self-pay | Admitting: Emergency Medicine

## 2020-09-18 ENCOUNTER — Inpatient Hospital Stay (HOSPITAL_COMMUNITY)
Admission: EM | Admit: 2020-09-18 | Discharge: 2020-09-20 | DRG: 282 | Disposition: A | Discharge status: Home / Self Care | Payer: Self-pay | Attending: Cardiovascular Disease | Admitting: Cardiovascular Disease

## 2020-09-18 ENCOUNTER — Emergency Department (HOSPITAL_COMMUNITY): Payer: Self-pay

## 2020-09-18 DIAGNOSIS — Z7984 Long term (current) use of oral hypoglycemic drugs: Secondary | ICD-10-CM

## 2020-09-18 DIAGNOSIS — Z6835 Body mass index (BMI) 35.0-35.9, adult: Secondary | ICD-10-CM

## 2020-09-18 DIAGNOSIS — F419 Anxiety disorder, unspecified: Secondary | ICD-10-CM | POA: Diagnosis present

## 2020-09-18 DIAGNOSIS — Z881 Allergy status to other antibiotic agents status: Secondary | ICD-10-CM

## 2020-09-18 DIAGNOSIS — E8881 Metabolic syndrome: Secondary | ICD-10-CM | POA: Diagnosis present

## 2020-09-18 DIAGNOSIS — E781 Pure hyperglyceridemia: Secondary | ICD-10-CM | POA: Diagnosis present

## 2020-09-18 DIAGNOSIS — Z720 Tobacco use: Secondary | ICD-10-CM

## 2020-09-18 DIAGNOSIS — M1990 Unspecified osteoarthritis, unspecified site: Secondary | ICD-10-CM | POA: Diagnosis present

## 2020-09-18 DIAGNOSIS — I214 Non-ST elevation (NSTEMI) myocardial infarction: Secondary | ICD-10-CM

## 2020-09-18 DIAGNOSIS — K219 Gastro-esophageal reflux disease without esophagitis: Secondary | ICD-10-CM | POA: Diagnosis present

## 2020-09-18 DIAGNOSIS — Z20822 Contact with and (suspected) exposure to covid-19: Secondary | ICD-10-CM | POA: Diagnosis present

## 2020-09-18 DIAGNOSIS — F17210 Nicotine dependence, cigarettes, uncomplicated: Secondary | ICD-10-CM | POA: Diagnosis present

## 2020-09-18 DIAGNOSIS — F909 Attention-deficit hyperactivity disorder, unspecified type: Secondary | ICD-10-CM | POA: Diagnosis present

## 2020-09-18 DIAGNOSIS — E1169 Type 2 diabetes mellitus with other specified complication: Secondary | ICD-10-CM

## 2020-09-18 DIAGNOSIS — G4733 Obstructive sleep apnea (adult) (pediatric): Secondary | ICD-10-CM | POA: Diagnosis present

## 2020-09-18 DIAGNOSIS — Z888 Allergy status to other drugs, medicaments and biological substances status: Secondary | ICD-10-CM

## 2020-09-18 DIAGNOSIS — Z79899 Other long term (current) drug therapy: Secondary | ICD-10-CM

## 2020-09-18 DIAGNOSIS — Z8249 Family history of ischemic heart disease and other diseases of the circulatory system: Secondary | ICD-10-CM

## 2020-09-18 DIAGNOSIS — I10 Essential (primary) hypertension: Secondary | ICD-10-CM | POA: Diagnosis present

## 2020-09-18 DIAGNOSIS — E7800 Pure hypercholesterolemia, unspecified: Secondary | ICD-10-CM

## 2020-09-18 DIAGNOSIS — Z833 Family history of diabetes mellitus: Secondary | ICD-10-CM

## 2020-09-18 DIAGNOSIS — E119 Type 2 diabetes mellitus without complications: Secondary | ICD-10-CM | POA: Diagnosis present

## 2020-09-18 DIAGNOSIS — E669 Obesity, unspecified: Secondary | ICD-10-CM | POA: Diagnosis present

## 2020-09-18 DIAGNOSIS — E785 Hyperlipidemia, unspecified: Secondary | ICD-10-CM | POA: Diagnosis present

## 2020-09-18 DIAGNOSIS — I517 Cardiomegaly: Secondary | ICD-10-CM

## 2020-09-18 DIAGNOSIS — Z635 Disruption of family by separation and divorce: Secondary | ICD-10-CM

## 2020-09-18 LAB — COMPREHENSIVE METABOLIC PANEL
ALT: 29 U/L (ref 0–44)
AST: 25 U/L (ref 15–41)
Albumin: 4 g/dL (ref 3.5–5.0)
Alkaline Phosphatase: 83 U/L (ref 38–126)
Anion gap: 9 (ref 5–15)
BUN: 19 mg/dL (ref 6–20)
CO2: 24 mmol/L (ref 22–32)
Calcium: 8.3 mg/dL — ABNORMAL LOW (ref 8.9–10.3)
Chloride: 101 mmol/L (ref 98–111)
Creatinine, Ser: 0.91 mg/dL (ref 0.61–1.24)
GFR, Estimated: >60 mL/min (ref >60)
Glucose, Bld: 248 mg/dL — ABNORMAL HIGH (ref 70–99)
Potassium: 3.6 mmol/L (ref 3.5–5.1)
Sodium: 134 mmol/L — ABNORMAL LOW (ref 135–145)
Total Bilirubin: 0.8 mg/dL (ref 0.3–1.2)
Total Protein: 7 g/dL (ref 6.5–8.1)

## 2020-09-18 LAB — HEPARIN LEVEL (UNFRACTIONATED)
Heparin Unfractionated: 0.12 IU/mL — ABNORMAL LOW (ref 0.30–0.70)
Heparin Unfractionated: 0.12 IU/mL — ABNORMAL LOW (ref 0.30–0.70)
Heparin Unfractionated: 0.31 IU/mL (ref 0.30–0.70)

## 2020-09-18 LAB — ECHOCARDIOGRAM COMPLETE
Area-P 1/2: 3.08 cm2
Height: 71 in
S' Lateral: 2.6 cm
Weight: 4032 oz

## 2020-09-18 LAB — CBC WITH DIFFERENTIAL/PLATELET
Abs Immature Granulocytes: 0.02 10*3/uL (ref 0.00–0.07)
Basophils Absolute: 0.1 10*3/uL (ref 0.0–0.1)
Basophils Relative: 1 %
Eosinophils Absolute: 0.3 10*3/uL (ref 0.0–0.5)
Eosinophils Relative: 4 %
HCT: 39.8 % (ref 39.0–52.0)
Hemoglobin: 13.8 g/dL (ref 13.0–17.0)
Immature Granulocytes: 0 %
Lymphocytes Relative: 32 %
Lymphs Abs: 3 10*3/uL (ref 0.7–4.0)
MCH: 29.6 pg (ref 26.0–34.0)
MCHC: 34.7 g/dL (ref 30.0–36.0)
MCV: 85.2 fL (ref 80.0–100.0)
Monocytes Absolute: 0.9 10*3/uL (ref 0.1–1.0)
Monocytes Relative: 10 %
Neutro Abs: 5.2 10*3/uL (ref 1.7–7.7)
Neutrophils Relative %: 53 %
Platelets: 317 10*3/uL (ref 150–400)
RBC: 4.67 MIL/uL (ref 4.22–5.81)
RDW: 12.4 % (ref 11.5–15.5)
WBC: 9.6 10*3/uL (ref 4.0–10.5)
nRBC: 0 % (ref 0.0–0.2)

## 2020-09-18 LAB — RESP PANEL BY RT-PCR (FLU A&B, COVID) ARPGX2
Influenza A by PCR: NEGATIVE
Influenza B by PCR: NEGATIVE
SARS Coronavirus 2 by RT PCR: NEGATIVE

## 2020-09-18 LAB — GLUCOSE, CAPILLARY
Glucose-Capillary: 246 mg/dL — ABNORMAL HIGH (ref 70–99)
Glucose-Capillary: 169 mg/dL — ABNORMAL HIGH (ref 70–99)
Glucose-Capillary: 243 mg/dL — ABNORMAL HIGH (ref 70–99)

## 2020-09-18 LAB — TROPONIN I (HIGH SENSITIVITY)
Troponin I (High Sensitivity): 563 ng/L (ref <18)
Troponin I (High Sensitivity): 740 ng/L (ref <18)
Troponin I (High Sensitivity): 894 ng/L (ref <18)
Troponin I (High Sensitivity): 901 ng/L (ref <18)

## 2020-09-18 LAB — MRSA PCR SCREENING: MRSA by PCR: NEGATIVE

## 2020-09-18 LAB — HIV ANTIBODY (ROUTINE TESTING W REFLEX): HIV Screen 4th Generation wRfx: NONREACTIVE

## 2020-09-18 MED ORDER — IOHEXOL 350 MG/ML SOLN
75.0000 mL | Freq: Once | INTRAVENOUS | Status: AC | PRN
  Administered 2020-09-18: 75 mL via INTRAVENOUS

## 2020-09-18 MED ORDER — INSULIN ASPART 100 UNIT/ML IJ SOLN
0.0000 [IU] | Freq: Every day | INTRAMUSCULAR | Status: DC
  Administered 2020-09-18 – 2020-09-19 (×2): 2 [IU] via SUBCUTANEOUS

## 2020-09-18 MED ORDER — SODIUM CHLORIDE 0.9% FLUSH
3.0000 mL | Freq: Two times a day (BID) | INTRAVENOUS | Status: DC
  Administered 2020-09-18 – 2020-09-19 (×4): 3 mL via INTRAVENOUS

## 2020-09-18 MED ORDER — HEPARIN (PORCINE) 25000 UT/250ML-% IV SOLN
1850.0000 [IU]/h | INTRAVENOUS | Status: DC
  Administered 2020-09-18: 1700 [IU]/h via INTRAVENOUS
  Administered 2020-09-18: 1400 [IU]/h via INTRAVENOUS
  Administered 2020-09-19 (×2): 1850 [IU]/h via INTRAVENOUS
  Filled 2020-09-18 (×4): qty 250

## 2020-09-18 MED ORDER — SODIUM CHLORIDE 0.9 % WEIGHT BASED INFUSION
3.0000 mL/kg/h | INTRAVENOUS | Status: DC
  Administered 2020-09-20: 3 mL/kg/h via INTRAVENOUS

## 2020-09-18 MED ORDER — ATORVASTATIN CALCIUM 80 MG PO TABS
80.0000 mg | ORAL_TABLET | Freq: Every day | ORAL | Status: DC
  Administered 2020-09-18 – 2020-09-19 (×2): 80 mg via ORAL
  Filled 2020-09-18 (×2): qty 1

## 2020-09-18 MED ORDER — ALPRAZOLAM 0.5 MG PO TABS: 1.0000 mg | ORAL_TABLET | Freq: Every evening | ORAL | Status: DC | PRN

## 2020-09-18 MED ORDER — ONDANSETRON HCL 4 MG/2ML IJ SOLN: 4.0000 mg | Freq: Four times a day (QID) | INTRAMUSCULAR | Status: DC | PRN

## 2020-09-18 MED ORDER — SODIUM CHLORIDE 0.9 % WEIGHT BASED INFUSION
1.0000 mL/kg/h | INTRAVENOUS | Status: DC
  Administered 2020-09-20: 1 mL/kg/h via INTRAVENOUS

## 2020-09-18 MED ORDER — LISINOPRIL 20 MG PO TABS
20.0000 mg | ORAL_TABLET | Freq: Every day | ORAL | Status: DC
  Administered 2020-09-18 – 2020-09-20 (×3): 20 mg via ORAL
  Filled 2020-09-18 (×3): qty 1

## 2020-09-18 MED ORDER — INSULIN ASPART 100 UNIT/ML IJ SOLN
0.0000 [IU] | Freq: Three times a day (TID) | INTRAMUSCULAR | Status: DC
Start: 2020-09-18 — End: 2020-09-20
  Administered 2020-09-18: 3 [IU] via SUBCUTANEOUS
  Administered 2020-09-19: 2 [IU] via SUBCUTANEOUS
  Administered 2020-09-19: 3 [IU] via SUBCUTANEOUS
  Administered 2020-09-20: 2 [IU] via SUBCUTANEOUS
  Administered 2020-09-20: 3 [IU] via SUBCUTANEOUS

## 2020-09-18 MED ORDER — ACETAMINOPHEN 325 MG PO TABS: 650.0000 mg | ORAL_TABLET | ORAL | Status: DC | PRN

## 2020-09-18 MED ORDER — HEPARIN BOLUS VIA INFUSION
4000.0000 [IU] | Freq: Once | INTRAVENOUS | Status: AC
  Administered 2020-09-18: 4000 [IU] via INTRAVENOUS

## 2020-09-18 MED ORDER — SODIUM CHLORIDE 0.9% FLUSH: 3.0000 mL | INTRAVENOUS | Status: DC | PRN

## 2020-09-18 MED ORDER — NITROGLYCERIN 0.4 MG SL SUBL: 0.4000 mg | SUBLINGUAL_TABLET | SUBLINGUAL | Status: DC | PRN

## 2020-09-18 MED ORDER — PERFLUTREN LIPID MICROSPHERE
1.0000 mL | INTRAVENOUS | Status: AC | PRN
  Filled 2020-09-18: qty 10

## 2020-09-18 MED ORDER — METOPROLOL TARTRATE 12.5 MG HALF TABLET
12.5000 mg | ORAL_TABLET | Freq: Two times a day (BID) | ORAL | Status: DC
  Administered 2020-09-18 – 2020-09-20 (×5): 12.5 mg via ORAL
  Filled 2020-09-18 (×5): qty 1

## 2020-09-18 MED ORDER — PANTOPRAZOLE SODIUM 40 MG PO TBEC: 40.0000 mg | DELAYED_RELEASE_TABLET | Freq: Every day | ORAL | Status: DC

## 2020-09-18 MED ORDER — ASPIRIN EC 81 MG PO TBEC
81.0000 mg | DELAYED_RELEASE_TABLET | Freq: Every day | ORAL | Status: DC
  Administered 2020-09-18 – 2020-09-20 (×3): 81 mg via ORAL
  Filled 2020-09-18 (×3): qty 1

## 2020-09-18 MED ORDER — PANTOPRAZOLE SODIUM 40 MG PO TBEC
40.0000 mg | DELAYED_RELEASE_TABLET | Freq: Every day | ORAL | Status: DC
  Administered 2020-09-18 – 2020-09-20 (×3): 40 mg via ORAL
  Filled 2020-09-18 (×3): qty 1

## 2020-09-18 MED ORDER — HEPARIN BOLUS VIA INFUSION
2000.0000 [IU] | Freq: Once | INTRAVENOUS | Status: AC
  Administered 2020-09-18: 2000 [IU] via INTRAVENOUS
  Filled 2020-09-18: qty 2000

## 2020-09-18 MED ORDER — SODIUM CHLORIDE 0.9 % IV SOLN: 250.0000 mL | INTRAVENOUS | Status: DC | PRN

## 2020-09-18 NOTE — ED Triage Notes (Signed)
Pt c/o chest tightness, back pain and bilateral arm pain for the past 2 nights when he is laying in bed trying to sleep. Pt states pain is gone now.

## 2020-09-18 NOTE — Progress Notes (Signed)
ANTICOAGULATION CONSULT NOTE - Initial Consult  Pharmacy Consult for heparin Indication: chest pain/ACS  Allergies  Allergen Reactions  . Doxycycline Swelling  . Hctz [Hydrochlorothiazide] Swelling    Patient Measurements: Height: 5\' 11"  (180.3 cm) Weight: 114.3 kg (252 lb) IBW/kg (Calculated) : 75.3 Heparin Dosing Weight: 100kg  Vital Signs: Temp: 97.8 F (36.6 C) (04/30 0220) Temp Source: Oral (04/30 0220) BP: 117/83 (04/30 0500) Pulse Rate: 80 (04/30 0500)  Labs: Recent Labs    09/18/20 0335  HGB 13.8  HCT 39.8  PLT 317  CREATININE 0.91  TROPONINIHS 563*    Estimated Creatinine Clearance: 133.2 mL/min (by C-G formula based on SCr of 0.91 mg/dL).   Medical History: Past Medical History:  Diagnosis Date  . ADHD (attention deficit hyperactivity disorder)   . Anxiety   . Arthritis   . Complication of anesthesia yrs ago   woke up during knee surgery  . Diabetes mellitus without complication (HCC)   . GERD (gastroesophageal reflux disease)   . Hypertension   . Numbness and tingling   . Pancreatitis   . Sinusitis     Assessment: 45yo male c/o chest tightness, back pain, and BUE pain x2 nights, pain free on arrival to ED, troponin found to be elevated, to begin heparin.  Goal of Therapy:  Heparin level 0.3-0.7 units/ml Monitor platelets by anticoagulation protocol: Yes   Plan:  Will give heparin 4000 units IV bolus x1 followed by gtt at 1400 units/hr and monitor heparin levels and CBC.  45yo, PharmD, BCPS  09/18/2020,5:06 AM

## 2020-09-18 NOTE — Plan of Care (Signed)
  Problem: Education: Goal: Knowledge of General Education information will improve Description: Including pain rating scale, medication(s)/side effects and non-pharmacologic comfort measures Outcome: Progressing   Problem: Health Behavior/Discharge Planning: Goal: Ability to manage health-related needs will improve Outcome: Progressing   Problem: Clinical Measurements: Goal: Ability to maintain clinical measurements within normal limits will improve Outcome: Progressing   Problem: Clinical Measurements: Goal: Diagnostic test results will improve Outcome: Progressing   Problem: Clinical Measurements: Goal: Cardiovascular complication will be avoided Outcome: Progressing   Problem: Nutrition: Goal: Adequate nutrition will be maintained Outcome: Progressing   Problem: Activity: Goal: Risk for activity intolerance will decrease Outcome: Progressing   Problem: Pain Managment: Goal: General experience of comfort will improve Outcome: Progressing

## 2020-09-18 NOTE — Progress Notes (Addendum)
Critical lab value -  Troponin 894 called to Dr Royann Shivers

## 2020-09-18 NOTE — ED Provider Notes (Signed)
[facility] EMERGENCY DEPARTMENT Provider Note   CSN: [id number] Arrival date & time: 09/18/20  0155     History Chief Complaint  Patient presents with  . Back Pain  . Chest Pain    Jeff Buck is a 45 y.o. male.  Patient with history of hypertriglyceridemia and diabetes, obesity, presents to the emergency department with complaints of chest pain.  Patient reports that when he laid down for bed tonight he had moderate to severe pain in the center of his chest, upper back radiating down both arms to the elbows.  Pain lasted about 40 minutes and then resolved.  On arrival to the ER he is pain-free.  He had a similar episode the night before when going to bed that self resolved as well.  No known history of CAD.        Past Medical History:  Diagnosis Date  . ADHD (attention deficit hyperactivity disorder)   . Anxiety   . Arthritis   . Complication of anesthesia yrs ago   woke up during knee surgery  . Diabetes mellitus without complication (HCC)   . GERD (gastroesophageal reflux disease)   . Hypertension   . Numbness and tingling   . Pancreatitis   . Sinusitis     Patient Active Problem List   Diagnosis Date Noted  . NSTEMI (non-ST elevated myocardial infarction) (HCC) 09/18/2020  . Bilateral carpal tunnel syndrome 03/10/2020  . Paresthesia 03/05/2020  . Neck pain 03/05/2020  . Hiatal hernia 06/16/2016  . Pancreatitis, acute 08/25/2015  . RUQ pain 08/25/2015  . Palpitations 04/06/2011  . Hypertension 04/06/2011  . Heartburn 04/06/2011    Past Surgical History:  Procedure Laterality Date  . CHOLECYSTECTOMY N/A 12/15/2015   Procedure: LAPAROSCOPIC CHOLECYSTECTOMY;  Surgeon: [name], MD;  Location: [facility];  Service: General;  Laterality: N/A;  . EUS N/A 09/23/2015   Dr. Christella Hartigan: examined esophagus, stomach, and duodenum were normal. Pancreas normal. Question of microlithiasis as culprit   . ORTHOPEDIC SURGERY     left knee surgery       Family  History  Problem Relation Age of Onset  . Heart failure Other   . Diabetes Mother   . Diabetes Father   . Colon cancer Neg Hx     Social History   Tobacco Use  . Smoking status: Former Smoker    Packs/day: 0.25    Years: 0.50    Pack years: 0.12    Types: Cigarettes  . Smokeless tobacco: Current User    Types: Chew  Substance Use Topics  . Alcohol use: No    Alcohol/week: 0.0 standard drinks  . Drug use: No    Home Medications Prior to Admission medications   Medication Sig Start Date End Date Taking? Authorizing Provider  ALPRAZolam Prudy Feeler) 1 MG tablet Take 1 mg by mouth at bedtime as needed. 12/22/19   [provider]  amphetamine-dextroamphetamine (ADDERALL) 20 MG tablet Take 20 mg by mouth daily. 01/17/20   [provider]  B Complex Vitamins (B COMPLEX-B12 PO) Take 1 tablet by mouth daily.    [provider]  cyclobenzaprine (FLEXERIL) 10 MG tablet Take 10 mg by mouth daily as needed.    [provider]  gabapentin (NEURONTIN) 300 MG capsule Take 1 capsule (300 mg total) by mouth 3 (three) times daily. 03/05/20   Levert Feinstein, MD  Ginkgo Biloba Extract 60 MG CAPS Take 1 capsule by mouth.    [provider]  glipiZIDE (GLUCOTROL  XL) 5 MG 24 hr tablet Take 5 mg by mouth. 02/13/20   [provider]  KRILL OIL PO Take 1 tablet by mouth daily.    [provider]  lisinopril (PRINIVIL,ZESTRIL) 20 MG tablet Take 20 mg by mouth daily.     [provider]  Multiple Vitamin (MULTIVITAMIN) capsule Take 1 capsule by mouth daily.    [provider]  Omega-3 Fatty Acids (FISH OIL OMEGA-3 PO) Take 1 capsule by mouth daily.    [provider]  pantoprazole (PROTONIX) 40 MG tablet Take 1 tablet (40 mg total) by mouth daily. 30 minutes before breakfast 06/16/16   Gelene Mink, NP    Allergies    Doxycycline and Hctz [hydrochlorothiazide]  Review of Systems   Review of Systems  Cardiovascular: Positive  for chest pain.  All other systems reviewed and are negative.   Physical Exam Updated Vital Signs BP 117/83   Pulse 80   Temp 97.8 F (36.6 C) (Oral)   Resp 14   Ht 5\' 11"  (1.803 m)   Wt 114.3 kg   SpO2 96%   BMI 35.15 kg/m   Physical Exam Vitals and nursing note reviewed.  Constitutional:      General: He is not in acute distress.    Appearance: Normal appearance. He is well-developed. He is obese.  HENT:     Head: Normocephalic and atraumatic.     Right Ear: Hearing normal.     Left Ear: Hearing normal.     Nose: Nose normal.  Eyes:     Conjunctiva/sclera: Conjunctivae normal.     Pupils: Pupils are equal, round, and reactive to light.  Cardiovascular:     Rate and Rhythm: Regular rhythm.     Heart sounds: S1 normal and S2 normal. No murmur heard. No friction rub. No gallop.   Pulmonary:     Effort: Pulmonary effort is normal. No respiratory distress.     Breath sounds: Normal breath sounds.  Chest:     Chest wall: No tenderness.  Abdominal:     General: Bowel sounds are normal.     Palpations: Abdomen is soft.     Tenderness: There is no abdominal tenderness. There is no guarding or rebound. Negative signs include Murphy's sign and McBurney's sign.     Hernia: No hernia is present.  Musculoskeletal:        General: Normal range of motion.     Cervical back: Normal range of motion and neck supple.  Skin:    General: Skin is warm and dry.     Findings: No rash.  Neurological:     Mental Status: He is alert and oriented to person, place, and time.     GCS: GCS eye subscore is 4. GCS verbal subscore is 5. GCS motor subscore is 6.     Cranial Nerves: No cranial nerve deficit.     Sensory: No sensory deficit.     Coordination: Coordination normal.  Psychiatric:        Speech: Speech normal.        Behavior: Behavior normal.        Thought Content: Thought content normal.     ED Results / Procedures / Treatments   Labs (all labs ordered are listed, but  only abnormal results are displayed) Labs Reviewed  COMPREHENSIVE METABOLIC PANEL - Abnormal; Notable for the following components:      Result Value   Sodium 134 (*)    Glucose, Bld 248 (*)  Calcium 8.3 (*)    All other components within normal limits  TROPONIN I (HIGH SENSITIVITY) - Abnormal; Notable for the following components:   Troponin I (High Sensitivity) 563 (*)    All other components within normal limits  RESP PANEL BY RT-PCR (FLU A&B, COVID) [id number]  CBC WITH DIFFERENTIAL/PLATELET  HEPARIN LEVEL (UNFRACTIONATED)  TROPONIN I (HIGH SENSITIVITY)    EKG EKG Interpretation  Date/Time:  Saturday September 18 2020 02:24:42 EDT Ventricular Rate:  79 PR Interval:  184 QRS Duration: 92 QT Interval:  357 QTC Calculation: 410 R Axis:   6 Text Interpretation: Sinus rhythm Borderline T wave abnormalities Confirmed by Gilda Crease ([id number]) on 09/18/2020 2:27:09 AM   Radiology DG Chest Port 1 View  Result Date: 09/18/2020 CLINICAL DATA:  Chest tightness and back pain. EXAM: PORTABLE CHEST 1 VIEW COMPARISON:  December 26, 2017 FINDINGS: The heart size and mediastinal contours are within normal limits. Both lungs are clear. The visualized skeletal structures are unremarkable. IMPRESSION: No active disease. Electronically Signed   By: Aram Candela M.D.   On: 09/18/2020 04:49    Procedures Procedures   Medications Ordered in ED Medications  heparin ADULT infusion 100 units/mL (25000 units/275mL) (1,400 Units/hr Intravenous New Bag/Given 09/18/20 0519)  heparin bolus via infusion 4,000 Units (4,000 Units Intravenous Bolus from Bag 09/18/20 0520)    ED Course  I have reviewed the triage vital signs and the nursing notes.  Pertinent labs & imaging results that were available during my care of the patient were reviewed by me and considered in my medical decision making (see chart for details).    MDM Rules/Calculators/A&P                          Patient with 2 episodes  of nonexertional chest pain over the last 24 hours.  He does have multiple cardiac risk factors but no known CAD.  Patient has been moderately sedentary over the last few months because of carpal tunnel procedures, has gained 40 or 50 pounds.  EKG at arrival with nonspecific T wave abnormalities but no ST elevations or obvious ischemia.  Troponin, however, is elevated.  He is currently pain-free.  Heparin initiated.  Discussed with Dr. Hyacinth Meeker, on-call for cardiology at [facility].  Patient will be accepted for transfer for admission for further work-up of NSTEMI.  CRITICAL CARE Performed by: Gilda Crease   Total critical care time: 35 minutes  Critical care time was exclusive of separately billable procedures and treating other patients.  Critical care was necessary to treat or prevent imminent or life-threatening deterioration.  Critical care was time spent personally by me on the following activities: development of treatment plan with patient and/or surrogate as well as nursing, discussions with consultants, evaluation of patient's response to treatment, examination of patient, obtaining history from patient or surrogate, ordering and performing treatments and interventions, ordering and review of laboratory studies, ordering and review of radiographic studies, pulse oximetry and re-evaluation of patient's condition.  Final Clinical Impression(s) / ED Diagnoses Final diagnoses:  NSTEMI (non-ST elevated myocardial infarction) [facility])    Rx / DC Orders ED Discharge Orders    None       Amare Bail, Canary Brim, MD 09/18/20 [phone]

## 2020-09-18 NOTE — Progress Notes (Signed)
ANTICOAGULATION CONSULT NOTE   Pharmacy Consult for heparin Indication: chest pain/ACS  Allergies  Allergen Reactions  . Doxycycline Swelling  . Hctz [Hydrochlorothiazide] Swelling    Patient Measurements: Height: 5\' 11"  (180.3 cm) Weight: 114.3 kg (252 lb) IBW/kg (Calculated) : 75.3 Heparin Dosing Weight: 100kg  Vital Signs: Temp: 98.2 F (36.8 C) (04/30 1907) Temp Source: Oral (04/30 1907) BP: 108/71 (04/30 1907) Pulse Rate: 72 (04/30 1907)  Labs: Recent Labs    09/18/20 0335 09/18/20 0552 09/18/20 1037 09/18/20 1213 09/18/20 1936  HGB 13.8  --   --   --   --   HCT 39.8  --   --   --   --   PLT 317  --   --   --   --   HEPARINUNFRC  --   --   --  0.12* 0.31  CREATININE 0.91  --   --   --   --   TROPONINIHS 563* 740* 894* 901*  --     Estimated Creatinine Clearance: 133.2 mL/min (by C-G formula based on SCr of 0.91 mg/dL).   Medical History: Past Medical History:  Diagnosis Date  . ADHD (attention deficit hyperactivity disorder)   . Anxiety   . Arthritis   . Complication of anesthesia yrs ago   woke up during knee surgery  . Diabetes mellitus without complication (HCC)   . GERD (gastroesophageal reflux disease)   . Hypertension   . Numbness and tingling   . Pancreatitis   . Sinusitis     Assessment: 45yo male c/o chest tightness, back pain, and BUE pain x2 nights, pain free on arrival to ED, troponin found to be elevated now on heparin. 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  Listed under [facility].

## 2020-09-18 NOTE — H&P (Addendum)
Cardiology Admission History and Physical:   Patient ID: Jeff Buck MRN: [id number]; DOB: July 02, 1975   Admission date: 09/18/2020  PCP:  Bernita Buffy   [facility]  Cardiologist: Remotely seen by [facility] in 2012; new to Dr. Royann Shivers Advanced Practice Provider:  No care team member to display Electrophysiologist:  None   Chief Complaint:  Chest pain  Patient Profile:   Jeff Buck is a 45 y.o. male with a PMH of HTN, hypertriglyceridemia, DM type 2, ADHD, anxiety, obesity, and tobacco abuse who presented to [facility] with complaints of chest pain.  History of Present Illness:   Mr. Dollard  was in his usual state of health until he laid down for bed on the night of 09/17/20 when he felt moderate to severe substernal chest pain and upper back pain, radiating down both arms to elbows. His symptoms lasted ~40 minutes before resolving spontaneously. He reported a similar episode the night before, prompting him to present to the ED for further evaluation.   He has no known heart disease or CHF history. He reports a remote heart catheterization in [place] in ~2012 to evaluate chest pain which he reports was normal and CP was attributed to MSK etiology. I could not find documentation of this.   He reports no significant family history of CAD or MI, though mother and father both have DM. He smokes ~6 cigarettes per day and uses chewing tobacco. He denies recreational drug use.   At the time of this evaluation he is chest pain free. Prior to 2 nights ago he had not had any issues with chest pain. He works on [employer] and while not terribly strenuous he remains fairly active. He worked a 14 hour day between his initial chest pain episode 09/16/20 and the recurrent episode 09/17/20 and had no issues with chest pain, DOE, or palpitations throughout the day. He denies trouble with LE edema, orthopnea, or PND. He does snore quite badly per wife. He reports  undergoing a sleep study 5+ years ago which was reportedly negative for OSA. He has gained some weight recently as he has been less active due to carpal tunnel issues. He reports longstanding issues with cholesterol/triglycerides. He has been off his omega 3 for the past month as he did not fill the prescription. Reports prior intolerance to cholesterol medicine but does not recall the name of the medication or the side effects which limited use. He reports BP has been stable and he is compliant with his HTN/DM medications.   ED course: VSS. Labs notable for Na 134, K 3.6, Cr 0.91, glucose 248, CBC wnl, HsTrop 563>740. EKG showed sinus rhythm, rate 79 bpm, non-specific T wave flattening, no STE/D; T wave abnormalities in inferior leads improved from 2019. CXR showed no acute findings. COVID-19/influenza negative. Patient was chest pain free on arrival to the ED. He was started on a heparin gtt in light of his elevated HsTrop. Patient was transferred from [facility] to [facility] for ongoing cardiology evaluation.    Past Medical History:  Diagnosis Date  . ADHD (attention deficit hyperactivity disorder)   . Anxiety   . Arthritis   . Complication of anesthesia yrs ago   woke up during knee surgery  . Diabetes mellitus without complication (HCC)   . GERD (gastroesophageal reflux disease)   . Hypertension   . Numbness and tingling   . Pancreatitis   . Sinusitis     Past Surgical History:  Procedure Laterality  Date  . CHOLECYSTECTOMY N/A 12/15/2015   Procedure: LAPAROSCOPIC CHOLECYSTECTOMY;  Surgeon: [name], MD;  Location: [facility];  Service: General;  Laterality: N/A;  . EUS N/A 09/23/2015   Dr. Christella Hartigan: examined esophagus, stomach, and duodenum were normal. Pancreas normal. Question of microlithiasis as culprit   . ORTHOPEDIC SURGERY     left knee surgery     Medications Prior to Admission: Prior to Admission medications   Medication Sig Start Date End Date Taking? Authorizing Provider   ALPRAZolam Prudy Feeler) 1 MG tablet Take 1 mg by mouth at bedtime as needed. 12/22/19   [provider]  amphetamine-dextroamphetamine (ADDERALL) 20 MG tablet Take 20 mg by mouth daily. 01/17/20   [provider]  B Complex Vitamins (B COMPLEX-B12 PO) Take 1 tablet by mouth daily.    [provider]  cyclobenzaprine (FLEXERIL) 10 MG tablet Take 10 mg by mouth daily as needed.    [provider]  gabapentin (NEURONTIN) 300 MG capsule Take 1 capsule (300 mg total) by mouth 3 (three) times daily. 03/05/20   Levert Feinstein, MD  Ginkgo Biloba Extract 60 MG CAPS Take 1 capsule by mouth.    [provider]  glipiZIDE (GLUCOTROL XL) 5 MG 24 hr tablet Take 5 mg by mouth. 02/13/20   [provider]  KRILL OIL PO Take 1 tablet by mouth daily.    [provider]  lisinopril (PRINIVIL,ZESTRIL) 20 MG tablet Take 20 mg by mouth daily.     [provider]  Multiple Vitamin (MULTIVITAMIN) capsule Take 1 capsule by mouth daily.    [provider]  Omega-3 Fatty Acids (FISH OIL OMEGA-3 PO) Take 1 capsule by mouth daily.    [provider]  pantoprazole (PROTONIX) 40 MG tablet Take 1 tablet (40 mg total) by mouth daily. 30 minutes before breakfast 06/16/16   Gelene Mink, NP     Allergies:    Allergies  Allergen Reactions  . Doxycycline Swelling  . Hctz [Hydrochlorothiazide] Swelling    Social History:   Social History   Socioeconomic History  . Marital status: Legally Separated    Spouse name: Not on file  . Number of children: 3  . Years of education: 11th grade  . Highest education level: Not on file  Occupational History  . Occupation: Nurse, children's  Tobacco Use  . Smoking status: Former Smoker    Packs/day: 0.25    Years: 0.50    Pack years: 0.12    Types: Cigarettes  . Smokeless tobacco: Current User    Types: Chew  Substance and Sexual Activity  . Alcohol use: No    Alcohol/week: 0.0 standard drinks  . Drug  use: No  . Sexual activity: Not Currently  Other Topics Concern  . Not on file  Social History Narrative   Lives at home with one of his sons.   Right-handed.   Caffeine: 3 sodas per day.   Social Determinants of Health   Financial Resource Strain: Not on file  Food Insecurity: Not on file  Transportation Needs: Not on file  Physical Activity: Not on file  Stress: Not on file  Social Connections: Not on file  Intimate Partner Violence: Not on file    Family History:   The patient's family history includes Diabetes in his father and mother; Heart failure in an other family member. There is no history of Colon cancer.    ROS:  Please see the history of present illness.  All other ROS reviewed and negative.     Physical Exam/Data:   Vitals:   09/18/20 0600 09/18/20 0630 09/18/20 0700 09/18/20 0730  BP: 130/88 120/89 (!) 123/95 127/87  Pulse: 73 80 73 77  Resp: 13 14 12 13   Temp:      TempSrc:      SpO2: 97% 97% 98% 97%  Weight:      Height:       No intake or output data in the 24 hours ending 09/18/20 0813 Last 3 Weights 09/18/2020 03/05/2020 02/24/2020  Weight (lbs) 252 lb 257 lb 235 lb  Weight (kg) 114.306 kg 116.574 kg 106.595 kg     Body mass index is 35.15 kg/m.  General:  Well nourished, well developed, in no acute distress HEENT: sclera anicteric Neck: no JVD Vascular: No carotid bruits; distal pulses 2+ bilaterally  Cardiac:  normal S1, S2; RRR; no murmurs, rubs, or gallops Lungs:  clear to auscultation bilaterally, no wheezing, rhonchi or rales  Abd: soft, obese, nontender, no hepatomegaly  Ext: no edema Musculoskeletal:  No deformities, BUE and BLE strength normal and equal Skin: warm and dry  Neuro:  CNs 2-12 intact, no focal abnormalities noted Psych:  Normal affect    EKG:  sinus rhythm, rate 79 bpm, non-specific T wave flattening, no STE/D; T wave abnormalities in inferior leads improved from 2019.   Relevant CV Studies: None  Laboratory  Data:  High Sensitivity Troponin:   Recent Labs  Lab 09/18/20 0335 09/18/20 0552  TROPONINIHS 563* 740*      Chemistry Recent Labs  Lab 09/18/20 0335  NA 134*  K 3.6  CL 101  CO2 24  GLUCOSE 248*  BUN 19  CREATININE 0.91  CALCIUM 8.3*  GFRNONAA >60  ANIONGAP 9    Recent Labs  Lab 09/18/20 0335  PROT 7.0  ALBUMIN 4.0  AST 25  ALT 29  ALKPHOS 83  BILITOT 0.8   Hematology Recent Labs  Lab 09/18/20 0335  WBC 9.6  RBC 4.67  HGB 13.8  HCT 39.8  MCV 85.2  MCH 29.6  MCHC 34.7  RDW 12.4  PLT 317   BNPNo results for input(s): BNP, PROBNP in the last 168 hours.  DDimer No results for input(s): DDIMER in the last 168 hours.   Radiology/Studies:  DG Chest Port 1 View  Result Date: 09/18/2020 CLINICAL DATA:  Chest tightness and back pain. EXAM: PORTABLE CHEST 1 VIEW COMPARISON:  December 26, 2017 FINDINGS: The heart size and mediastinal contours are within normal limits. Both lungs are clear. The visualized skeletal structures are unremarkable. IMPRESSION: No active disease. Electronically Signed   By: December 28, 2017 M.D.   On: 09/18/2020 04:49     Assessment and Plan:   1. NSTEMI in patient without known CAD: patient presented with recurrent chest pain upon laying down to sleep 09/17/20. Substernal chest pain/back pain radiating to bilateral arms. Resolve spontaneously after 40 minutes. Prior episode the night before prompting ED evaluation. EKG was non-ischemic, however HsTrop elevated from 563>740. No known CAD history, however does have risk factors including HTN, HLD, DM type 2, and obesity. He was started on a heparin gtt and transferred from [facility] for further evaluation - Continue heparin gtt - Will check an echocardiogram  - Anticipate LHC on Monday to evaluate coronary anatomy. Shared Decision Making/Informed Consent{ The risks [stroke (1 in 1000), death (1 in 1000), kidney failure [usually temporary] (1 in 500), bleeding (1 in 200), allergic reaction  [possibly serious] (  1 in 200)], benefits (diagnostic support and management of coronary artery disease) and alternatives of a cardiac catheterization were discussed in detail with Mr. Dayton ScrapeMurray and he is willing to proceed. - Will start aspirin 81mg  and atorvastatin 80mg  daily - Will start metoprolol tartrate 12.5mg  BID   2. HTN: BP generally well controlled this admission - Continue lisinopril and metoprolol tartrate as above  3. HLD: LDL 127 and triglycerides 161193 on lipid check 07/2020.  - Will update FLP in AM - Will start atorvastatin 80mg  daily given #1  4. DM type 2: A1C 7.5 07/2020; goal <7. On glipizide at home.  - Will update A1C in AM - Continue ISS this admission  5. GERD:  - Continue pantoprazole  6. Anxiety/ADHD: on adderall per PCP - Anticipate continuing home medication at discharge  7. Tobacco abuse: smokes 6 cigarettes per day and uses chewing tobacco. We discussed health risk with ongoing use - Continue to encourage cessation   Risk Assessment/Risk Scores:   TIMI Risk Score for Unstable Angina or Non-ST Elevation MI:   The patient's TIMI risk score is 2, which indicates a 8% risk of all cause mortality, new or recurrent myocardial infarction or need for urgent revascularization in the next 14       Severity of Illness: The appropriate patient status for this patient is OBSERVATION. Observation status is judged to be reasonable and necessary in order to provide the required intensity of service to ensure the patient's safety. The patient's presenting symptoms, physical exam findings, and initial radiographic and laboratory data in the context of their medical condition is felt to place them at decreased risk for further clinical deterioration. Furthermore, it is anticipated that the patient will be medically stable for discharge from the hospital within 2 midnights of admission. The following factors support the patient status of observation.   " The patient's  presenting symptoms include Chest pain. " The physical exam findings include obesity, benign cardiopulmonary exam. " The initial radiographic and laboratory data are Elevated HsTrop.     For questions or updates, please contact [facility] Please consult [email or web address] for contact info under     Signed, Beatriz StallionKrista M. Kroeger, PA-C  09/18/2020 8:13 AM    I have seen and examined the patient along with Beatriz StallionKrista M. Kroeger, PA-C .  I have reviewed the chart, notes and new data.  I agree with PA/NP's note.  Key new complaints: symptoms very concerning for unstable angina, occurring at rest, 45', two nights in a row. Chest discomfort radieating to arms very different from reflux symptoms that occur when bending over). Young, but w numerous risk factors. Key examination changes: obese; normal CV exam Key new findings / data: Possible new Q waves inferiorly (has an old rSR' pattern, initial r now barely visible) and mild elevation in troponin.  PLAN: Currently asymptomatic. Plan cardiac cath Monday morning (sooner if he develops symptoms and hi-risk ST changes). On IV heparin, ASA (can only take after he has his GERD medication), statin, beta blocker. The cardiac cath and possible PCI/stent procedure has been fully reviewed with the patient and written informed consent has been obtained.   Thurmon FairMihai Patrycja Mumpower, MD, [facility] CottageFACC [facility] [phone] 09/18/2020, 11:20 AM

## 2020-09-18 NOTE — Progress Notes (Signed)
ANTICOAGULATION CONSULT NOTE   Pharmacy Consult for heparin Indication: chest pain/ACS  Allergies  Allergen Reactions  . Doxycycline Swelling  . Hctz [Hydrochlorothiazide] Swelling    Patient Measurements: Height: 5\' 11"  (180.3 cm) Weight: 114.3 kg (252 lb) IBW/kg (Calculated) : 75.3 Heparin Dosing Weight: 100kg  Vital Signs: Temp: 98.2 F (36.8 C) (04/30 0818) Temp Source: Oral (04/30 0818) BP: 119/82 (04/30 0818) Pulse Rate: 81 (04/30 0818)  Labs: Recent Labs    09/18/20 0335 09/18/20 0552  HGB 13.8  --   HCT 39.8  --   PLT 317  --   CREATININE 0.91  --   TROPONINIHS 563* 740*    Estimated Creatinine Clearance: 133.2 mL/min (by C-G formula based on SCr of 0.91 mg/dL).   Medical History: Past Medical History:  Diagnosis Date  . ADHD (attention deficit hyperactivity disorder)   . Anxiety   . Arthritis   . Complication of anesthesia yrs ago   woke up during knee surgery  . Diabetes mellitus without complication (HCC)   . GERD (gastroesophageal reflux disease)   . Hypertension   . Numbness and tingling   . Pancreatitis   . Sinusitis     Assessment: 45yo male c/o chest tightness, back pain, and BUE pain x2 nights, pain free on arrival to ED, troponin found to be elevated, to start heparin.  Initial heparin below goal at 0.12, cbc within normal limits. Troponin still trending up.   Goal of Therapy:  Heparin level 0.3-0.7 units/ml Monitor platelets by anticoagulation protocol: Yes   Plan:  Will give heparin 2000 units IV bolus x1 followed by gtt at 1700 units/hr and monitor heparin levels and CBC.  45yo PharmD., BCPS Clinical Pharmacist 09/18/2020 9:06 AM

## 2020-09-18 NOTE — Progress Notes (Signed)
  Echocardiogram 2D Echocardiogram has been performed.  Pieter Partridge 09/18/2020, 3:07 PM

## 2020-09-18 NOTE — ED Notes (Signed)
Date and time results received: 09/18/20 0458  Test: Troponin Critical Value: 563  Name of Provider Notified: Blinda Leatherwood, MD  Orders Received? Or Actions Taken?: acknowledged

## 2020-09-19 LAB — TSH: TSH: 1.691 u[IU]/mL (ref 0.350–4.500)

## 2020-09-19 LAB — GLUCOSE, CAPILLARY
Glucose-Capillary: 212 mg/dL — ABNORMAL HIGH (ref 70–99)
Glucose-Capillary: 163 mg/dL — ABNORMAL HIGH (ref 70–99)
Glucose-Capillary: 196 mg/dL — ABNORMAL HIGH (ref 70–99)
Glucose-Capillary: 215 mg/dL — ABNORMAL HIGH (ref 70–99)

## 2020-09-19 LAB — CBC
HCT: 39 % (ref 39.0–52.0)
Hemoglobin: 13.3 g/dL (ref 13.0–17.0)
MCH: 29 pg (ref 26.0–34.0)
MCHC: 34.1 g/dL (ref 30.0–36.0)
MCV: 85 fL (ref 80.0–100.0)
Platelets: 293 10*3/uL (ref 150–400)
RBC: 4.59 MIL/uL (ref 4.22–5.81)
RDW: 12.4 % (ref 11.5–15.5)
WBC: 7.6 10*3/uL (ref 4.0–10.5)
nRBC: 0 % (ref 0.0–0.2)

## 2020-09-19 LAB — BASIC METABOLIC PANEL
Anion gap: 8 (ref 5–15)
BUN: 15 mg/dL (ref 6–20)
CO2: 25 mmol/L (ref 22–32)
Calcium: 8.4 mg/dL — ABNORMAL LOW (ref 8.9–10.3)
Chloride: 101 mmol/L (ref 98–111)
Creatinine, Ser: 0.91 mg/dL (ref 0.61–1.24)
GFR, Estimated: >60 mL/min (ref >60)
Glucose, Bld: 306 mg/dL — ABNORMAL HIGH (ref 70–99)
Potassium: 3.9 mmol/L (ref 3.5–5.1)
Sodium: 134 mmol/L — ABNORMAL LOW (ref 135–145)

## 2020-09-19 LAB — LIPID PANEL
Cholesterol: 163 mg/dL (ref 0–200)
HDL: 41 mg/dL (ref >40)
LDL Cholesterol: 57 mg/dL (ref 0–99)
Total CHOL/HDL Ratio: 4 RATIO
Triglycerides: 325 mg/dL — ABNORMAL HIGH (ref <150)
VLDL: 65 mg/dL — ABNORMAL HIGH (ref 0–40)

## 2020-09-19 LAB — HEPARIN LEVEL (UNFRACTIONATED): Heparin Unfractionated: 0.29 IU/mL — ABNORMAL LOW (ref 0.30–0.70)

## 2020-09-19 LAB — HEMOGLOBIN A1C
Hgb A1c MFr Bld: 7.6 % — ABNORMAL HIGH (ref 4.8–5.6)
Mean Plasma Glucose: 171.42 mg/dL

## 2020-09-19 NOTE — Plan of Care (Signed)
  Problem: Education: Goal: Knowledge of General Education information will improve Description: Including pain rating scale, medication(s)/side effects and non-pharmacologic comfort measures Outcome: Progressing   Problem: Health Behavior/Discharge Planning: Goal: Ability to manage health-related needs will improve Outcome: Progressing   Problem: Clinical Measurements: Goal: Ability to maintain clinical measurements within normal limits will improve Outcome: Progressing   Problem: Clinical Measurements: Goal: Diagnostic test results will improve Outcome: Progressing   Problem: Clinical Measurements: Goal: Cardiovascular complication will be avoided Outcome: Progressing   Problem: Activity: Goal: Risk for activity intolerance will decrease Outcome: Progressing   Problem: Nutrition: Goal: Adequate nutrition will be maintained Outcome: Progressing   Problem: Pain Managment: Goal: General experience of comfort will improve Outcome: Progressing

## 2020-09-19 NOTE — Progress Notes (Signed)
ANTICOAGULATION CONSULT NOTE   Pharmacy Consult for heparin Indication: chest pain/ACS  Labs: Recent Labs    09/18/20 0335 09/18/20 0552 09/18/20 1037 09/18/20 1213 09/18/20 1936 09/19/20 0021  HGB 13.8  --   --   --   --  13.3  HCT 39.8  --   --   --   --  39.0  PLT 317  --   --   --   --  293  HEPARINUNFRC  --   --   --  0.12* 0.31 0.29*  CREATININE 0.91  --   --   --   --  0.91  TROPONINIHS 563* 740* 894* 901*  --   --     Assessment: 45yo male c/o chest tightness, back pain, and BUE pain x2 nights, pain free on arrival to ED, troponin found to be elevated now on heparin. -heparin level 0.29 units/ml this morning   Goal of Therapy:  Heparin level 0.3-0.7 units/ml Monitor platelets by anticoagulation protocol: Yes   Plan:  -Increase heparin to 1850 units/hr -Heparin level and CBC in am  Thanks for allowing pharmacy to be a part of this patient's care.  Talbert Cage, PharmD Clinical Pharmacist

## 2020-09-19 NOTE — H&P (View-Only) (Signed)
Progress Note  Patient Name: Jeff Buck Date of Encounter: 09/19/2020  [facility] Cardiologist: No primary care provider on file. New  Subjective   No more angina overnight. Echocardiogram raise concern for pulmonary embolism due to right ventricular dilation, but there was no evidence of pulmonary artery thrombi on CT angiography. He is a very loud snorer, but he reports he has had "4 different sleep studies" that did not confirm the diagnosis.  If I understand his description correctly, the studies were inconclusive because he did not really sleep.  Denies daytime hypersomnolence and has never fallen asleep in inappropriate situations.  His wife confirms witnessed apnea at night.  Inpatient Medications    Scheduled Meds: . aspirin EC  81 mg Oral Daily  . atorvastatin  80 mg Oral QHS  . insulin aspart  0-5 Units Subcutaneous QHS  . insulin aspart  0-9 Units Subcutaneous TID WC  . lisinopril  20 mg Oral Daily  . metoprolol tartrate  12.5 mg Oral BID  . pantoprazole  40 mg Oral Q0600  . sodium chloride flush  3 mL Intravenous Q12H   Continuous Infusions: . sodium chloride    . [START ON 09/20/2020] sodium chloride     Followed by  . [START ON 09/20/2020] sodium chloride    . heparin 1,850 Units/hr (09/19/20 0811)   PRN Meds: sodium chloride, acetaminophen, ALPRAZolam, nitroGLYCERIN, ondansetron (ZOFRAN) IV, sodium chloride flush   Vital Signs    Vitals:   09/18/20 2130 09/18/20 2300 09/19/20 0300 09/19/20 0725  BP: 123/80 124/86 115/78 123/90  Pulse: 73 73 64 (!) 114  Resp:  15 10 15   Temp:  98 F (36.7 C) 97.8 F (36.6 C) 98.1 F (36.7 C)  TempSrc:  Oral Oral Oral  SpO2:  94% 95% 91%  Weight:   122.6 kg   Height:        Intake/Output Summary (Last 24 hours) at 09/19/2020 0944 Last data filed at 09/19/2020 0734 Gross per 24 hour  Intake 1607.13 ml  Output 2875 ml  Net -1267.87 ml   Last 3 Weights 09/19/2020 09/18/2020 03/05/2020  Weight (lbs) 270 lb 4.5 oz  252 lb 257 lb  Weight (kg) 122.6 kg 114.306 kg 116.574 kg      Telemetry    NSR - Personally Reviewed  ECG    Sinus rhythm, sharp inferior Q waves, no acute ischemic abnormalities- Personally Reviewed  Physical Exam  Obese, very broad neck and crowded oropharynx. GEN: No acute distress.   Neck: No JVD Cardiac: RRR, no murmurs, rubs, or gallops.  Respiratory: Clear to auscultation bilaterally. GI: Soft, nontender, non-distended  MS: No edema; No deformity. Neuro:  Nonfocal  Psych: Normal affect   Labs    High Sensitivity Troponin:   Recent Labs  Lab 09/18/20 0335 09/18/20 0552 09/18/20 1037 09/18/20 1213  TROPONINIHS 563* 740* 894* 901*      Chemistry Recent Labs  Lab 09/18/20 0335 09/19/20 0021  NA 134* 134*  K 3.6 3.9  CL 101 101  CO2 24 25  GLUCOSE 248* 306*  BUN 19 15  CREATININE 0.91 0.91  CALCIUM 8.3* 8.4*  PROT 7.0  --   ALBUMIN 4.0  --   AST 25  --   ALT 29  --   ALKPHOS 83  --   BILITOT 0.8  --   GFRNONAA >60 >60  ANIONGAP 9 8     Hematology Recent Labs  Lab 09/18/20 0335 09/19/20 0021  WBC 9.6 7.6  RBC 4.67 4.59  HGB 13.8 13.3  HCT 39.8 39.0  MCV 85.2 85.0  MCH 29.6 29.0  MCHC 34.7 34.1  RDW 12.4 12.4  PLT 317 293    BNPNo results for input(s): BNP, PROBNP in the last 168 hours.   DDimer No results for input(s): DDIMER in the last 168 hours.   Radiology    CT ANGIO CHEST PE W OR WO CONTRAST  Result Date: 09/18/2020 CLINICAL DATA:  Chest pain and shortness of breath. EXAM: CT ANGIOGRAPHY CHEST WITH CONTRAST TECHNIQUE: Multidetector CT imaging of the chest was performed using the standard protocol during bolus administration of intravenous contrast. Multiplanar CT image reconstructions and MIPs were obtained to evaluate the vascular anatomy. CONTRAST:  43mL OMNIPAQUE IOHEXOL 350 MG/ML SOLN COMPARISON:  None. FINDINGS: Cardiovascular: Contrast injection is sufficient to demonstrate satisfactory opacification of the pulmonary  arteries to the segmental level. There is no pulmonary embolus or evidence of right heart strain. The size of the main pulmonary artery is normal. Heart size is normal, with no pericardial effusion. The course and caliber of the aorta are normal. There is no atherosclerotic calcification. Opacification decreased due to pulmonary arterial phase contrast bolus timing. Mediastinum/Nodes: -- No mediastinal lymphadenopathy. -- No hilar lymphadenopathy. -- No axillary lymphadenopathy. -- No supraclavicular lymphadenopathy. -- Normal thyroid gland where visualized. -  Unremarkable esophagus. Lungs/Pleura: Airways are patent. No pleural effusion, lobar consolidation, pneumothorax or pulmonary infarction. Upper Abdomen: Contrast bolus timing is not optimized for evaluation of the abdominal organs. The visualized portions of the organs of the upper abdomen are normal. Musculoskeletal: No chest wall abnormality. No bony spinal canal stenosis. Review of the MIP images confirms the above findings. IMPRESSION: Unremarkable exam. Electronically Signed   By: Katherine Mantle M.D.   On: 09/18/2020 20:42   DG Chest Port 1 View  Result Date: 09/18/2020 CLINICAL DATA:  Chest tightness and back pain. EXAM: PORTABLE CHEST 1 VIEW COMPARISON:  December 26, 2017 FINDINGS: The heart size and mediastinal contours are within normal limits. Both lungs are clear. The visualized skeletal structures are unremarkable. IMPRESSION: No active disease. Electronically Signed   By: Aram Candela M.D.   On: 09/18/2020 04:49   ECHOCARDIOGRAM COMPLETE  Result Date: 09/18/2020    ECHOCARDIOGRAM REPORT   Patient Name:   Jeff Buck Date of Exam: 09/18/2020 Medical Rec #:  [id number]        Height:       71.0 in Accession #:    [id number]       Weight:       252.0 lb Date of Birth:  Dec 20, 1975         BSA:          2.326 m Patient Age:    44 years         BP:           126/77 mmHg Patient Gender: M                HR:           70 bpm. Exam  Location:  Inpatient Procedure: 2D Echo and Intracardiac Opacification Agent Indications:    NSTEMI  History:        Patient has no prior history of Echocardiogram examinations.                 Acute MI, Signs/Symptoms:Chest Pain; Risk Factors:Hypertension,                 Diabetes,  Dyslipidemia, Current Smoker and Morbid obesity.  Sonographer:    Lavenia Atlas Referring Phys: [id number] Beatriz Stallion  Sonographer Comments: Patient is morbidly obese. Image acquisition challenging due to patient body habitus. IMPRESSIONS  1. Left ventricular ejection fraction, by estimation, is 55 to 60%. The left ventricle has normal function. The left ventricle has no regional wall motion abnormalities. Left ventricular diastolic parameters were normal.  2. McConnell's sign may be present (limited evaluation of RV, seen well only on Definity images). Right ventricular systolic function is low normal. The right ventricular size is mildly enlarged. Tricuspid regurgitation signal is inadequate for assessing PA pressure.  3. The mitral valve is normal in structure. No evidence of mitral valve regurgitation. No evidence of mitral stenosis.  4. The aortic valve is normal in structure. Aortic valve regurgitation is not visualized. No aortic stenosis is present.  5. The inferior vena cava is normal in size with greater than 50% respiratory variability, suggesting right atrial pressure of 3 mmHg. Conclusion(s)/Recommendation(s): Consider evaluation for pulmonary embolism. FINDINGS  Left Ventricle: Left ventricular ejection fraction, by estimation, is 55 to 60%. The left ventricle has normal function. The left ventricle has no regional wall motion abnormalities. Definity contrast agent was given IV to delineate the left ventricular  endocardial borders. The left ventricular internal cavity size was normal in size. There is no left ventricular hypertrophy. Left ventricular diastolic parameters were normal. Normal left ventricular filling  pressure. Right Ventricle: McConnell's sign may be present (limited evaluation of RV, seen well only on Definity images). The right ventricular size is mildly enlarged. No increase in right ventricular wall thickness. Right ventricular systolic function is low normal. Tricuspid regurgitation signal is inadequate for assessing PA pressure. Left Atrium: Left atrial size was normal in size. Right Atrium: Right atrial size was normal in size. Pericardium: There is no evidence of pericardial effusion. Mitral Valve: The mitral valve is normal in structure. No evidence of mitral valve regurgitation. No evidence of mitral valve stenosis. Tricuspid Valve: The tricuspid valve is normal in structure. Tricuspid valve regurgitation is not demonstrated. No evidence of tricuspid stenosis. Aortic Valve: The aortic valve is normal in structure. Aortic valve regurgitation is not visualized. No aortic stenosis is present. Pulmonic Valve: The pulmonic valve was normal in structure. Pulmonic valve regurgitation is not visualized. No evidence of pulmonic stenosis. Aorta: The aortic root is normal in size and structure. Venous: The inferior vena cava is normal in size with greater than 50% respiratory variability, suggesting right atrial pressure of 3 mmHg. IAS/Shunts: No atrial level shunt detected by color flow Doppler.  LEFT VENTRICLE PLAX 2D LVIDd:         5.20 cm  Diastology LVIDs:         2.60 cm  LV e' medial:    7.62 cm/s LV PW:         1.20 cm  LV E/e' medial:  6.8 LV IVS:        1.10 cm  LV e' lateral:   10.00 cm/s LVOT diam:     2.50 cm  LV E/e' lateral: 5.2 LV SV:         88 LV SV Index:   38 LVOT Area:     4.91 cm  RIGHT VENTRICLE RV Basal diam:  4.00 cm RV S prime:     11.00 cm/s TAPSE (M-mode): 2.1 cm LEFT ATRIUM           Index       RIGHT ATRIUM  Index LA diam:      3.30 cm 1.42 cm/m  RA Area:     15.50 cm LA Vol (A2C): 38.1 ml 16.38 ml/m RA Volume:   42.80 ml  18.40 ml/m LA Vol (A4C): 53.9 ml 23.17 ml/m   AORTIC VALVE LVOT Vmax:   97.90 cm/s LVOT Vmean:  67.900 cm/s LVOT VTI:    0.179 m  AORTA Ao Root diam: 3.80 cm MITRAL VALVE MV Area (PHT): 3.08 cm    SHUNTS MV Decel Time: 246 msec    Systemic VTI:  0.18 m MV E velocity: 51.80 cm/s  Systemic Diam: 2.50 cm MV A velocity: 58.70 cm/s MV E/A ratio:  0.88 Derrian Poli MD Electronically signed by Thurmon FairMihai Nilsa Macht MD Signature Date/Time: 09/18/2020/4:41:24 PM    Final     Cardiac Studies   Echo above Patient Profile     45 y.o. male with obesity, triglyceridemia, type 2 diabetes mellitus, essential hypertension, smoker, history of anxiety and ADHD, presenting with symptoms strongly suggestive of unstable angina and abnormal cardiac troponin I  Assessment & Plan    1. NSTEMI: Small amount of myocardial injury based on cardiac enzymes and no visible wall motion abnormalities on echo.  Plan for coronary angiography and revascularization as indicated in the morning.  [place] cardiac [place]  Unable to retrieve records. This procedure has been fully reviewed with the patient and written informed consent has been obtained. 2. RV dilation: No PE on CTA. He has findings suggestive of obstructive sleep apnea, not withstanding the previous "normal" sleep studies.  I think this diagnosis should be pursued further.  Weight loss recommended. 3. DM: Mediocre control, most recent hemoglobin A1c 7.6%. 4. HLP: Moderate hypertriglyceridemia and low HDL cholesterol consistent with insulin resistance, his LDL is good on current medications. 5. GERD: Has severe symptoms with bending over, cannot tolerate aspirin list he receives PPI 6.  Discussed smoking cessation in detail.  For questions or updates, please contact [facility] Please consult [email or web address] for contact info under        Signed, Thurmon FairMihai Avonda Toso, MD  09/19/2020, 9:44 AM

## 2020-09-19 NOTE — Progress Notes (Signed)
Progress Note  Patient Name: Jeff Buck Date of Encounter: 09/19/2020  [facility] Cardiologist: No primary care provider on file. New  Subjective   No more angina overnight. Echocardiogram raise concern for pulmonary embolism due to right ventricular dilation, but there was no evidence of pulmonary artery thrombi on CT angiography. He is a very loud snorer, but he reports he has had "4 different sleep studies" that did not confirm the diagnosis.  If I understand his description correctly, the studies were inconclusive because he did not really sleep.  Denies daytime hypersomnolence and has never fallen asleep in inappropriate situations.  His wife confirms witnessed apnea at night.  Inpatient Medications    Scheduled Meds: . aspirin EC  81 mg Oral Daily  . atorvastatin  80 mg Oral QHS  . insulin aspart  0-5 Units Subcutaneous QHS  . insulin aspart  0-9 Units Subcutaneous TID WC  . lisinopril  20 mg Oral Daily  . metoprolol tartrate  12.5 mg Oral BID  . pantoprazole  40 mg Oral Q0600  . sodium chloride flush  3 mL Intravenous Q12H   Continuous Infusions: . sodium chloride    . [START ON 09/20/2020] sodium chloride     Followed by  . [START ON 09/20/2020] sodium chloride    . heparin 1,850 Units/hr (09/19/20 0811)   PRN Meds: sodium chloride, acetaminophen, ALPRAZolam, nitroGLYCERIN, ondansetron (ZOFRAN) IV, sodium chloride flush   Vital Signs    Vitals:   09/18/20 2130 09/18/20 2300 09/19/20 0300 09/19/20 0725  BP: 123/80 124/86 115/78 123/90  Pulse: 73 73 64 (!) 114  Resp:  15 10 15   Temp:  98 F (36.7 C) 97.8 F (36.6 C) 98.1 F (36.7 C)  TempSrc:  Oral Oral Oral  SpO2:  94% 95% 91%  Weight:   122.6 kg   Height:        Intake/Output Summary (Last 24 hours) at 09/19/2020 0944 Last data filed at 09/19/2020 0734 Gross per 24 hour  Intake 1607.13 ml  Output 2875 ml  Net -1267.87 ml   Last 3 Weights 09/19/2020 09/18/2020 03/05/2020  Weight (lbs) 270 lb 4.5 oz  252 lb 257 lb  Weight (kg) 122.6 kg 114.306 kg 116.574 kg      Telemetry    NSR - Personally Reviewed  ECG    Sinus rhythm, sharp inferior Q waves, no acute ischemic abnormalities- Personally Reviewed  Physical Exam  Obese, very broad neck and crowded oropharynx. GEN: No acute distress.   Neck: No JVD Cardiac: RRR, no murmurs, rubs, or gallops.  Respiratory: Clear to auscultation bilaterally. GI: Soft, nontender, non-distended  MS: No edema; No deformity. Neuro:  Nonfocal  Psych: Normal affect   Labs    High Sensitivity Troponin:   Recent Labs  Lab 09/18/20 0335 09/18/20 0552 09/18/20 1037 09/18/20 1213  TROPONINIHS 563* 740* 894* 901*      Chemistry Recent Labs  Lab 09/18/20 0335 09/19/20 0021  NA 134* 134*  K 3.6 3.9  CL 101 101  CO2 24 25  GLUCOSE 248* 306*  BUN 19 15  CREATININE 0.91 0.91  CALCIUM 8.3* 8.4*  PROT 7.0  --   ALBUMIN 4.0  --   AST 25  --   ALT 29  --   ALKPHOS 83  --   BILITOT 0.8  --   GFRNONAA >60 >60  ANIONGAP 9 8     Hematology Recent Labs  Lab 09/18/20 0335 09/19/20 0021  WBC 9.6 7.6  RBC 4.67 4.59  HGB 13.8 13.3  HCT 39.8 39.0  MCV 85.2 85.0  MCH 29.6 29.0  MCHC 34.7 34.1  RDW 12.4 12.4  PLT 317 293    BNPNo results for input(s): BNP, PROBNP in the last 168 hours.   DDimer No results for input(s): DDIMER in the last 168 hours.   Radiology    CT ANGIO CHEST PE W OR WO CONTRAST  Result Date: 09/18/2020 CLINICAL DATA:  Chest pain and shortness of breath. EXAM: CT ANGIOGRAPHY CHEST WITH CONTRAST TECHNIQUE: Multidetector CT imaging of the chest was performed using the standard protocol during bolus administration of intravenous contrast. Multiplanar CT image reconstructions and MIPs were obtained to evaluate the vascular anatomy. CONTRAST:  43mL OMNIPAQUE IOHEXOL 350 MG/ML SOLN COMPARISON:  None. FINDINGS: Cardiovascular: Contrast injection is sufficient to demonstrate satisfactory opacification of the pulmonary  arteries to the segmental level. There is no pulmonary embolus or evidence of right heart strain. The size of the main pulmonary artery is normal. Heart size is normal, with no pericardial effusion. The course and caliber of the aorta are normal. There is no atherosclerotic calcification. Opacification decreased due to pulmonary arterial phase contrast bolus timing. Mediastinum/Nodes: -- No mediastinal lymphadenopathy. -- No hilar lymphadenopathy. -- No axillary lymphadenopathy. -- No supraclavicular lymphadenopathy. -- Normal thyroid gland where visualized. -  Unremarkable esophagus. Lungs/Pleura: Airways are patent. No pleural effusion, lobar consolidation, pneumothorax or pulmonary infarction. Upper Abdomen: Contrast bolus timing is not optimized for evaluation of the abdominal organs. The visualized portions of the organs of the upper abdomen are normal. Musculoskeletal: No chest wall abnormality. No bony spinal canal stenosis. Review of the MIP images confirms the above findings. IMPRESSION: Unremarkable exam. Electronically Signed   By: Katherine Mantle M.D.   On: 09/18/2020 20:42   DG Chest Port 1 View  Result Date: 09/18/2020 CLINICAL DATA:  Chest tightness and back pain. EXAM: PORTABLE CHEST 1 VIEW COMPARISON:  December 26, 2017 FINDINGS: The heart size and mediastinal contours are within normal limits. Both lungs are clear. The visualized skeletal structures are unremarkable. IMPRESSION: No active disease. Electronically Signed   By: Aram Candela M.D.   On: 09/18/2020 04:49   ECHOCARDIOGRAM COMPLETE  Result Date: 09/18/2020    ECHOCARDIOGRAM REPORT   Patient Name:   Jeff Buck Date of Exam: 09/18/2020 Medical Rec #:  [id number]        Height:       71.0 in Accession #:    [id number]       Weight:       252.0 lb Date of Birth:  Dec 20, 1975         BSA:          2.326 m Patient Age:    44 years         BP:           126/77 mmHg Patient Gender: M                HR:           70 bpm. Exam  Location:  Inpatient Procedure: 2D Echo and Intracardiac Opacification Agent Indications:    NSTEMI  History:        Patient has no prior history of Echocardiogram examinations.                 Acute MI, Signs/Symptoms:Chest Pain; Risk Factors:Hypertension,                 Diabetes,  Dyslipidemia, Current Smoker and Morbid obesity.  Sonographer:    Lavenia Atlas Referring Phys: [id number] Beatriz Stallion  Sonographer Comments: Patient is morbidly obese. Image acquisition challenging due to patient body habitus. IMPRESSIONS  1. Left ventricular ejection fraction, by estimation, is 55 to 60%. The left ventricle has normal function. The left ventricle has no regional wall motion abnormalities. Left ventricular diastolic parameters were normal.  2. McConnell's sign may be present (limited evaluation of RV, seen well only on Definity images). Right ventricular systolic function is low normal. The right ventricular size is mildly enlarged. Tricuspid regurgitation signal is inadequate for assessing PA pressure.  3. The mitral valve is normal in structure. No evidence of mitral valve regurgitation. No evidence of mitral stenosis.  4. The aortic valve is normal in structure. Aortic valve regurgitation is not visualized. No aortic stenosis is present.  5. The inferior vena cava is normal in size with greater than 50% respiratory variability, suggesting right atrial pressure of 3 mmHg. Conclusion(s)/Recommendation(s): Consider evaluation for pulmonary embolism. FINDINGS  Left Ventricle: Left ventricular ejection fraction, by estimation, is 55 to 60%. The left ventricle has normal function. The left ventricle has no regional wall motion abnormalities. Definity contrast agent was given IV to delineate the left ventricular  endocardial borders. The left ventricular internal cavity size was normal in size. There is no left ventricular hypertrophy. Left ventricular diastolic parameters were normal. Normal left ventricular filling  pressure. Right Ventricle: McConnell's sign may be present (limited evaluation of RV, seen well only on Definity images). The right ventricular size is mildly enlarged. No increase in right ventricular wall thickness. Right ventricular systolic function is low normal. Tricuspid regurgitation signal is inadequate for assessing PA pressure. Left Atrium: Left atrial size was normal in size. Right Atrium: Right atrial size was normal in size. Pericardium: There is no evidence of pericardial effusion. Mitral Valve: The mitral valve is normal in structure. No evidence of mitral valve regurgitation. No evidence of mitral valve stenosis. Tricuspid Valve: The tricuspid valve is normal in structure. Tricuspid valve regurgitation is not demonstrated. No evidence of tricuspid stenosis. Aortic Valve: The aortic valve is normal in structure. Aortic valve regurgitation is not visualized. No aortic stenosis is present. Pulmonic Valve: The pulmonic valve was normal in structure. Pulmonic valve regurgitation is not visualized. No evidence of pulmonic stenosis. Aorta: The aortic root is normal in size and structure. Venous: The inferior vena cava is normal in size with greater than 50% respiratory variability, suggesting right atrial pressure of 3 mmHg. IAS/Shunts: No atrial level shunt detected by color flow Doppler.  LEFT VENTRICLE PLAX 2D LVIDd:         5.20 cm  Diastology LVIDs:         2.60 cm  LV e' medial:    7.62 cm/s LV PW:         1.20 cm  LV E/e' medial:  6.8 LV IVS:        1.10 cm  LV e' lateral:   10.00 cm/s LVOT diam:     2.50 cm  LV E/e' lateral: 5.2 LV SV:         88 LV SV Index:   38 LVOT Area:     4.91 cm  RIGHT VENTRICLE RV Basal diam:  4.00 cm RV S prime:     11.00 cm/s TAPSE (M-mode): 2.1 cm LEFT ATRIUM           Index       RIGHT ATRIUM  Index LA diam:      3.30 cm 1.42 cm/m  RA Area:     15.50 cm LA Vol (A2C): 38.1 ml 16.38 ml/m RA Volume:   42.80 ml  18.40 ml/m LA Vol (A4C): 53.9 ml 23.17 ml/m   AORTIC VALVE LVOT Vmax:   97.90 cm/s LVOT Vmean:  67.900 cm/s LVOT VTI:    0.179 m  AORTA Ao Root diam: 3.80 cm MITRAL VALVE MV Area (PHT): 3.08 cm    SHUNTS MV Decel Time: 246 msec    Systemic VTI:  0.18 m MV E velocity: 51.80 cm/s  Systemic Diam: 2.50 cm MV A velocity: 58.70 cm/s MV E/A ratio:  0.88 Akacia Boltz MD Electronically signed by Areliz Rothman MD Signature Date/Time: 09/18/2020/4:41:24 PM    Final     Cardiac Studies   Echo above Patient Profile     44 y.o. male with obesity, triglyceridemia, type 2 diabetes mellitus, essential hypertension, smoker, history of anxiety and ADHD, presenting with symptoms strongly suggestive of unstable angina and abnormal cardiac troponin I  Assessment & Plan    1. NSTEMI: Small amount of myocardial injury based on cardiac enzymes and no visible wall motion abnormalities on echo.  Plan for coronary angiography and revascularization as indicated in the morning.  He has had a previous cardiac catheterization at least 9 or 10 years ago in [place] that was "normal".  Unable to retrieve records. This procedure has been fully reviewed with the patient and written informed consent has been obtained. 2. RV dilation: No PE on CTA. He has findings suggestive of obstructive sleep apnea, not withstanding the previous "normal" sleep studies.  I think this diagnosis should be pursued further.  Weight loss recommended. 3. DM: Mediocre control, most recent hemoglobin A1c 7.6%. 4. HLP: Moderate hypertriglyceridemia and low HDL cholesterol consistent with insulin resistance, his LDL is good on current medications. 5. GERD: Has severe symptoms with bending over, cannot tolerate aspirin list he receives PPI 6.  Discussed smoking cessation in detail.  For questions or updates, please contact [facility] Please consult [email or web address] for contact info under        Signed, Ersie Savino, MD  09/19/2020, 9:44 AM    

## 2020-09-20 ENCOUNTER — Encounter (HOSPITAL_COMMUNITY): Payer: Self-pay | Admitting: Cardiovascular Disease

## 2020-09-20 ENCOUNTER — Encounter (HOSPITAL_COMMUNITY)
Admission: EM | Disposition: A | Discharge status: Home / Self Care | Payer: Self-pay | Source: Home / Self Care | Attending: Cardiovascular Disease

## 2020-09-20 ENCOUNTER — Telehealth: Payer: Self-pay

## 2020-09-20 DIAGNOSIS — E1169 Type 2 diabetes mellitus with other specified complication: Secondary | ICD-10-CM

## 2020-09-20 DIAGNOSIS — E669 Obesity, unspecified: Secondary | ICD-10-CM

## 2020-09-20 DIAGNOSIS — E785 Hyperlipidemia, unspecified: Secondary | ICD-10-CM

## 2020-09-20 DIAGNOSIS — G4730 Sleep apnea, unspecified: Secondary | ICD-10-CM

## 2020-09-20 DIAGNOSIS — I517 Cardiomegaly: Secondary | ICD-10-CM

## 2020-09-20 DIAGNOSIS — I214 Non-ST elevation (NSTEMI) myocardial infarction: Secondary | ICD-10-CM

## 2020-09-20 DIAGNOSIS — K219 Gastro-esophageal reflux disease without esophagitis: Secondary | ICD-10-CM

## 2020-09-20 HISTORY — PX: LEFT HEART CATH AND CORONARY ANGIOGRAPHY: CATH118249

## 2020-09-20 LAB — GLUCOSE, CAPILLARY
Glucose-Capillary: 172 mg/dL — ABNORMAL HIGH (ref 70–99)
Glucose-Capillary: 219 mg/dL — ABNORMAL HIGH (ref 70–99)

## 2020-09-20 LAB — CBC
HCT: 38 % — ABNORMAL LOW (ref 39.0–52.0)
Hemoglobin: 12.9 g/dL — ABNORMAL LOW (ref 13.0–17.0)
MCH: 28.9 pg (ref 26.0–34.0)
MCHC: 33.9 g/dL (ref 30.0–36.0)
MCV: 85 fL (ref 80.0–100.0)
Platelets: 311 10*3/uL (ref 150–400)
RBC: 4.47 MIL/uL (ref 4.22–5.81)
RDW: 12.3 % (ref 11.5–15.5)
WBC: 10.3 10*3/uL (ref 4.0–10.5)
nRBC: 0 % (ref 0.0–0.2)

## 2020-09-20 LAB — HEPARIN LEVEL (UNFRACTIONATED): Heparin Unfractionated: 0.42 IU/mL (ref 0.30–0.70)

## 2020-09-20 SURGERY — LEFT HEART CATH AND CORONARY ANGIOGRAPHY
Anesthesia: LOCAL

## 2020-09-20 MED ORDER — HYDRALAZINE HCL 20 MG/ML IJ SOLN: 10.0000 mg | INTRAMUSCULAR | Status: DC | PRN

## 2020-09-20 MED ORDER — LABETALOL HCL 5 MG/ML IV SOLN
10.0000 mg | INTRAVENOUS | Status: DC | PRN
Start: 2020-09-20 — End: 2020-09-20

## 2020-09-20 MED ORDER — IOHEXOL 350 MG/ML SOLN
INTRAVENOUS | Status: DC | PRN
  Administered 2020-09-20: 40 mL

## 2020-09-20 MED ORDER — MIDAZOLAM HCL 2 MG/2ML IJ SOLN
INTRAMUSCULAR | Status: AC
  Filled 2020-09-20: qty 2

## 2020-09-20 MED ORDER — MIDAZOLAM HCL 2 MG/2ML IJ SOLN
INTRAMUSCULAR | Status: DC | PRN
  Administered 2020-09-20: 1 mg via INTRAVENOUS

## 2020-09-20 MED ORDER — NITROGLYCERIN 0.4 MG SL SUBL: 0.4000 mg | SUBLINGUAL_TABLET | SUBLINGUAL | 3 refills | Status: AC | PRN

## 2020-09-20 MED ORDER — METOPROLOL TARTRATE 25 MG PO TABS: 12.5000 mg | ORAL_TABLET | Freq: Two times a day (BID) | ORAL | 6 refills | Status: AC

## 2020-09-20 MED ORDER — METOPROLOL TARTRATE 12.5 MG HALF TABLET: 12.5000 mg | ORAL_TABLET | Freq: Two times a day (BID) | ORAL | Status: DC

## 2020-09-20 MED ORDER — LIDOCAINE HCL (PF) 1 % IJ SOLN
INTRAMUSCULAR | Status: DC | PRN
  Administered 2020-09-20: 2 mL via INTRADERMAL

## 2020-09-20 MED ORDER — NITROGLYCERIN 1 MG/10 ML FOR IR/CATH LAB
INTRA_ARTERIAL | Status: AC
  Filled 2020-09-20: qty 10

## 2020-09-20 MED ORDER — ACETAMINOPHEN 325 MG PO TABS: 650.0000 mg | ORAL_TABLET | ORAL | Status: DC | PRN

## 2020-09-20 MED ORDER — VERAPAMIL HCL 2.5 MG/ML IV SOLN
INTRA_ARTERIAL | Status: DC | PRN
  Administered 2020-09-20: 5 mL via INTRA_ARTERIAL

## 2020-09-20 MED ORDER — HEPARIN (PORCINE) IN NACL 1000-0.9 UT/500ML-% IV SOLN
INTRAVENOUS | Status: AC
  Filled 2020-09-20: qty 1000

## 2020-09-20 MED ORDER — LIDOCAINE HCL (PF) 1 % IJ SOLN
INTRAMUSCULAR | Status: AC
  Filled 2020-09-20: qty 30

## 2020-09-20 MED ORDER — MORPHINE SULFATE (PF) 2 MG/ML IV SOLN: 2.0000 mg | INTRAVENOUS | Status: DC | PRN

## 2020-09-20 MED ORDER — ASPIRIN 81 MG PO TBEC: 81.0000 mg | DELAYED_RELEASE_TABLET | Freq: Every day | ORAL | 11 refills | Status: AC

## 2020-09-20 MED ORDER — ATORVASTATIN CALCIUM 80 MG PO TABS: 80.0000 mg | ORAL_TABLET | Freq: Every day | ORAL | 6 refills | Status: AC

## 2020-09-20 MED ORDER — FENTANYL CITRATE (PF) 100 MCG/2ML IJ SOLN
INTRAMUSCULAR | Status: DC | PRN
  Administered 2020-09-20: 25 ug via INTRAVENOUS

## 2020-09-20 MED ORDER — SODIUM CHLORIDE 0.9% FLUSH: 3.0000 mL | INTRAVENOUS | Status: DC | PRN

## 2020-09-20 MED ORDER — HEPARIN SODIUM (PORCINE) 1000 UNIT/ML IJ SOLN
INTRAMUSCULAR | Status: DC | PRN
  Administered 2020-09-20: 6000 [IU] via INTRAVENOUS

## 2020-09-20 MED ORDER — ONDANSETRON HCL 4 MG/2ML IJ SOLN: 4.0000 mg | Freq: Four times a day (QID) | INTRAMUSCULAR | Status: DC | PRN

## 2020-09-20 MED ORDER — SODIUM CHLORIDE 0.9% FLUSH: 3.0000 mL | Freq: Two times a day (BID) | INTRAVENOUS | Status: DC

## 2020-09-20 MED ORDER — SODIUM CHLORIDE 0.9 % IV SOLN: INTRAVENOUS | Status: AC

## 2020-09-20 MED ORDER — VERAPAMIL HCL 2.5 MG/ML IV SOLN
INTRAVENOUS | Status: AC
  Filled 2020-09-20: qty 2

## 2020-09-20 MED ORDER — FENTANYL CITRATE (PF) 100 MCG/2ML IJ SOLN
INTRAMUSCULAR | Status: AC
  Filled 2020-09-20: qty 2

## 2020-09-20 MED ORDER — SODIUM CHLORIDE 0.9 % IV SOLN: 250.0000 mL | INTRAVENOUS | Status: DC | PRN

## 2020-09-20 MED ORDER — HEPARIN SODIUM (PORCINE) 1000 UNIT/ML IJ SOLN
INTRAMUSCULAR | Status: AC
  Filled 2020-09-20: qty 1

## 2020-09-20 MED ORDER — HEPARIN (PORCINE) IN NACL 1000-0.9 UT/500ML-% IV SOLN
INTRAVENOUS | Status: DC | PRN
  Administered 2020-09-20 (×2): 500 mL

## 2020-09-20 SURGICAL SUPPLY — 8 items
CATH OPTITORQUE TIG 4.0 5F (CATHETERS) ×2 IMPLANT
DEVICE RAD COMP TR BAND LRG (VASCULAR PRODUCTS) ×2 IMPLANT
GLIDESHEATH SLEND A-KIT 6F 22G (SHEATH) ×2 IMPLANT
KIT HEART LEFT (KITS) ×2 IMPLANT
PACK CARDIAC CATHETERIZATION (CUSTOM PROCEDURE TRAY) ×2 IMPLANT
TRANSDUCER W/STOPCOCK (MISCELLANEOUS) ×2 IMPLANT
TUBING CIL FLEX 10 FLL-RA (TUBING) ×2 IMPLANT
WIRE HI TORQ VERSACORE-J 145CM (WIRE) ×2 IMPLANT

## 2020-09-20 NOTE — Discharge Summary (Addendum)
Discharge Summary    Patient ID: Jeff Buck MRN: [id number]; DOB: 1976-02-08  Admit date: 09/18/2020 Discharge date: 09/20/2020  PCP:  Bernita Buffy   [facility]  Cardiologist:  Thurmon Fair, MD  Advanced Practice Provider:  No care team member to display Electrophysiologist:  None   Discharge Diagnoses    Principal Problem:   NSTEMI (non-ST elevated myocardial infarction) [facility]) Active Problems:   Right ventricular dilation   GERD (gastroesophageal reflux disease)   Diabetes mellitus type 2 in obese [facility])   Hyperlipidemia    Diagnostic Studies/Procedures    2D echo 09/18/20 1. Left ventricular ejection fraction, by estimation, is 55 to 60%. The left ventricle has normal function. The left ventricle has no regional wall motion abnormalities. Left ventricular diastolic parameters were normal.  2. McConnell's sign may be present (limited evaluation of RV, seen well only on Definity images). Right ventricular systolic function is low normal. The right ventricular size is mildly enlarged. Tricuspid regurgitation signal is inadequate for  assessing PA pressure.  3. The mitral valve is normal in structure. No evidence of mitral valve regurgitation. No evidence of mitral stenosis.  4. The aortic valve is normal in structure. Aortic valve regurgitation is not visualized. No aortic stenosis is present.  5. The inferior vena cava is normal in size with greater than 50%  respiratory variability, suggesting right atrial pressure of 3 mmHg.   Conclusion(s)/Recommendation(s): Consider evaluation for pulmonary embolism.    Cardiac Cath 09/20/20   Intervention    IMPRESSION: Jeff Buck has clean coronary arteries and normal LV function by 2D echo with normal LVEDP.  There were no culprit lesions.  I suspect this was related to coronary vasospasm.  The sheath was removed and a TR band was placed on the right wrist to achieve patent  hemostasis.  The patient left lab in stable condition.  Nanetta Batty. MD, [facility] 09/20/2020 8:08 AM   _____________   History of Present Illness     Jeff Buck is a 45 y.o. male with HTN, hypertriglyceridemia, DM type 2, ADHD, anxiety, obesity, and tobacco abuse who presented to [facility] upon transfer from [facility] for chest pain. Jeff Buck had a reported remote heart cath in 2012 at OSH which was unrevealing. 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 He recalled undergoing a sleep study 5+ years ago reportedly negative for sleep apnea. He presented to the hospital with moderate-severe substernal chest pain and upper back pain radiating down both arms to elbows. His symptoms lasted ~40 minutes before resolving spontaneously. He reported a similar episode the night before, prompting him to present to the ED for further evaluation.  ED course: VSS. Labs notable for Na 134 in setting of elevated glucose 248, K 3.6, Cr 0.91, CBC wnl, HsTrop 563>740. EKG showed sinus rhythm, rate 79 bpm, non-specific T wave flattening, no STE/D; T wave abnormalities in inferior leads improved from 2019. CXR showed no acute findings. COVID-19/influenza negative. Patient was chest pain free on arrival to the ED. He was started on a heparin gtt in light of his elevated HsTrop. Patient was transferred from [facility] to [facility] for ongoing cardiology evaluation.    Hospital Course     1. NSTEMI - troponin trend [phone] - 2D echo showed EF 55-60%, no RWMA, normal diastolic parameters, limited evaluation of RV with low-normal function, mildly enlarged RV, cannot exclude McConnell's sign - CT angio demonstrated no evidence for PE and  he was not tachycardic, tachypneic or hypoxic - underwent definitive cardiac cath today showing clean coronary arteries with no culprit lesions  - this was suspected either due to coronary vasospasm or diabetic small vessel disease - Dr. Jacques Navy recommended to  continue the medications started this admission which include baby ASA, metoprolol, atorvastatin - he does have a h/o GERD but reports he can tolerate low dose ASA when he takes his PPI. He is currently managed on Protonix and did well in the hospital so will continue EC low dose ASA at discharge but he was encouraged to notify his MD if he develops any worsening acid reflux symptoms  2. RV dilation - no PE on CTA - he has findings suggestive of obstructive sleep apnea, not withstanding the previous "normal" sleep studies - message sent to [facility] triage nursing staff to assist with setting up repeat sleep study - wife had also reported witnessed apnea per notes - he has no other symptoms of daytime hypersomnolence or falling asleep inappropriately  3. DM - mediocre control, most recent hemoglobin A1c 7.6% - continued on home glipizide - advised to f/u PCP  4. HLP - moderate hypertriglyceridemia and low HDL cholesterol consistent with insulin resistance. LDL 57 - Dr. Jacques Navy recommended continuation of atorvastatin at  daily given his DM and possible small vessel disease - If the patient is tolerating statin at time of follow-up appointment, would consider rechecking liver function/lipid panel in 6-8 weeks  5. GERD - as outlined above  Dr. Jacques Navy has seen and examined the patient today and feels he is stable for discharge. He was given permission to return to work in 1 week, work note given. Follow-up has been arranged.  --------------------  Did the patient have an acute coronary syndrome (MI, NSTEMI, STEMI, etc) this admission?:  Yes                               AHA/ACC Clinical Performance & Quality Measures: 1. Aspirin prescribed? - Yes 2. ADP Receptor Inhibitor (Plavix/Clopidogrel, Brilinta/Ticagrelor or Effient/Prasugrel) prescribed (includes medically managed patients)? - No - no obstructive CAD found 3. Beta Blocker prescribed? - Yes 4. High Intensity Statin (Lipitor  40-80mg  or Crestor 20-40mg ) prescribed? - Yes 5. EF assessed during THIS hospitalization? - Yes 6. For EF <40%, was ACEI/ARB prescribed? - Not Applicable (EF >/= 40%) 7. For EF <40%, Aldosterone Antagonist (Spironolactone or Eplerenone) prescribed? - Not Applicable (EF >/= 40%) 8. Cardiac Rehab Phase II ordered (including medically managed patients)? - Yes   _____________  Discharge Vitals Blood pressure 125/87, pulse 65, temperature 98 F (36.7 C), temperature source Oral, resp. rate 15, height  (1.803 m), weight 119.1 kg, SpO2 96 %.  Filed Weights   09/18/20 0219 09/19/20 0300 09/20/20 0359  Weight: 114.3 kg 122.6 kg 119.1 kg   Examined by MD General: Well developed, well nourished WM, in no acute distress. Head: Normocephalic, atraumatic, sclera non-icteric, no xanthomas, nares are without discharge. Neck: Negative for carotid bruits. JVP not elevated. Lungs: Clear bilaterally to auscultation without wheezes, rales, or rhonchi. Breathing is unlabored. Heart: RRR S1 S2 without murmurs, rubs, or gallops.  Abdomen: Soft, non-tender, non-distended with normoactive bowel sounds. No rebound/guarding. Extremities: No clubbing or cyanosis. No edema. Distal pedal pulses are 2+ and equal bilaterally. Right radial cath site without hematoma or ecchymosis; good pulse. Neuro: Alert and oriented X 3. Moves all extremities spontaneously. Psych:  Responds to questions appropriately  with a normal affect.  Labs & Radiologic Studies    CBC Recent Labs    09/18/20 0335 09/19/20 0021 09/20/20 0045  WBC 9.6 7.6 10.3  NEUTROABS 5.2  --   --   HGB 13.8 13.3 12.9*  HCT 39.8 39.0 38.0*  MCV 85.2 85.0 85.0  PLT 317 293 311   Basic Metabolic Panel Recent Labs    16/10/96 0335 09/19/20 0021  NA 134* 134*  K 3.6 3.9  CL 101 101  CO2 24 25  GLUCOSE 248* 306*  BUN 19 15  CREATININE 0.91 0.91  CALCIUM 8.3* 8.4*   Liver Function Tests Recent Labs    09/18/20 0335  AST 25  ALT 29   ALKPHOS 83  BILITOT 0.8  PROT 7.0  ALBUMIN 4.0   No results for input(s): LIPASE, AMYLASE in the last 72 hours. High Sensitivity Troponin:   Recent Labs  Lab 09/18/20 0335 09/18/20 0552 09/18/20 1037 09/18/20 1213  TROPONINIHS 563* 740* 894* 901*    BNP Invalid input(s): POCBNP D-Dimer No results for input(s): DDIMER in the last 72 hours. Hemoglobin A1C Recent Labs    09/19/20 0040  HGBA1C 7.6*   Fasting Lipid Panel Recent Labs    09/19/20 0021  CHOL 163  HDL 41  LDLCALC 57  TRIG 325*  CHOLHDL 4.0   Thyroid Function Tests Recent Labs    09/19/20 0021  TSH 1.691   _____________  CT ANGIO CHEST PE W OR WO CONTRAST  Result Date: 09/18/2020 CLINICAL DATA:  Chest pain and shortness of breath. EXAM: CT ANGIOGRAPHY CHEST WITH CONTRAST TECHNIQUE: Multidetector CT imaging of the chest was performed using the standard protocol during bolus administration of intravenous contrast. Multiplanar CT image reconstructions and MIPs were obtained to evaluate the vascular anatomy. CONTRAST:  75mL OMNIPAQUE IOHEXOL 350 MG/ML SOLN COMPARISON:  None. FINDINGS: Cardiovascular: Contrast injection is sufficient to demonstrate satisfactory opacification of the pulmonary arteries to the segmental level. There is no pulmonary embolus or evidence of right heart strain. The size of the main pulmonary artery is normal. Heart size is normal, with no pericardial effusion. The course and caliber of the aorta are normal. There is no atherosclerotic calcification. Opacification decreased due to pulmonary arterial phase contrast bolus timing. Mediastinum/Nodes: -- No mediastinal lymphadenopathy. -- No hilar lymphadenopathy. -- No axillary lymphadenopathy. -- No supraclavicular lymphadenopathy. -- Normal thyroid gland where visualized. -  Unremarkable esophagus. Lungs/Pleura: Airways are patent. No pleural effusion, lobar consolidation, pneumothorax or pulmonary infarction. Upper Abdomen: Contrast bolus  timing is not optimized for evaluation of the abdominal organs. The visualized portions of the organs of the upper abdomen are normal. Musculoskeletal: No chest wall abnormality. No bony spinal canal stenosis. Review of the MIP images confirms the above findings. IMPRESSION: Unremarkable exam. Electronically Signed   By: Katherine Mantle M.D.   On: 09/18/2020 20:42   CARDIAC CATHETERIZATION  Result Date: 09/20/2020 Jeff Buck is a 45 y.o. male  [id number] LOCATION:  FACILITY: [facility] PHYSICIAN: Nanetta Batty, M.D. [birth year] DATE OF PROCEDURE:  09/20/2020 DATE OF DISCHARGE: CARDIAC CATHETERIZATION History obtained from chart review.45 y.o. male with obesity, triglyceridemia, type 2 diabetes mellitus, essential hypertension, smoker, history of anxiety and ADHD, presenting with symptoms strongly suggestive of unstable angina and abnormal cardiac troponin I   Jeff Buck has clean coronary arteries and normal LV function by 2D echo with normal LVEDP.  There were no culprit lesions.  I suspect this was related to coronary vasospasm.  The sheath was removed  and a TR band was placed on the right wrist to achieve patent hemostasis.  The patient left lab in stable condition. Nanetta BattyJonathan Berry. MD, [facility] 09/20/2020 8:08 AM   DG Chest Port 1 View  Result Date: 09/18/2020 CLINICAL DATA:  Chest tightness and back pain. EXAM: PORTABLE CHEST 1 VIEW COMPARISON:  December 26, 2017 FINDINGS: The heart size and mediastinal contours are within normal limits. Both lungs are clear. The visualized skeletal structures are unremarkable. IMPRESSION: No active disease. Electronically Signed   By: Aram Candelahaddeus  Houston M.D.   On: 09/18/2020 04:49   ECHOCARDIOGRAM COMPLETE  Result Date: 09/18/2020    ECHOCARDIOGRAM REPORT   Patient Name:   Jeff Buck Date of Exam: 09/18/2020 Medical Rec #:  [id number]        Height:       71.0 in Accession #:    [id number]       Weight:       252.0 lb Date of Birth:  07/25/1975         BSA:          2.326 m  Patient Age:    44 years         BP:           126/77 mmHg Patient Gender: M                HR:           70 bpm. Exam Location:  Inpatient Procedure: 2D Echo and Intracardiac Opacification Agent Indications:    NSTEMI  History:        Patient has no prior history of Echocardiogram examinations.                 Acute MI, Signs/Symptoms:Chest Pain; Risk Factors:Hypertension,                 Diabetes, Dyslipidemia, Current Smoker and Morbid obesity.  Sonographer:    Lavenia AtlasBrooke Strickland Referring Phys: [id number] Beatriz StallionKRISTA M. KROEGER  Sonographer Comments: Patient is morbidly obese. Image acquisition challenging due to patient body habitus. IMPRESSIONS  1. Left ventricular ejection fraction, by estimation, is 55 to 60%. The left ventricle has normal function. The left ventricle has no regional wall motion abnormalities. Left ventricular diastolic parameters were normal.  2. McConnell's sign may be present (limited evaluation of RV, seen well only on Definity images). Right ventricular systolic function is low normal. The right ventricular size is mildly enlarged. Tricuspid regurgitation signal is inadequate for assessing PA pressure.  3. The mitral valve is normal in structure. No evidence of mitral valve regurgitation. No evidence of mitral stenosis.  4. The aortic valve is normal in structure. Aortic valve regurgitation is not visualized. No aortic stenosis is present.  5. The inferior vena cava is normal in size with greater than 50% respiratory variability, suggesting right atrial pressure of 3 mmHg. Conclusion(s)/Recommendation(s): Consider evaluation for pulmonary embolism. FINDINGS  Left Ventricle: Left ventricular ejection fraction, by estimation, is 55 to 60%. The left ventricle has normal function. The left ventricle has no regional wall motion abnormalities. Definity contrast agent was given IV to delineate the left ventricular  endocardial borders. The left ventricular internal cavity size was normal in size. There  is no left ventricular hypertrophy. Left ventricular diastolic parameters were normal. Normal left ventricular filling pressure. Right Ventricle: McConnell's sign may be present (limited evaluation of RV, seen well only on Definity images). The right ventricular size is mildly enlarged. No increase in right ventricular wall  thickness. Right ventricular systolic function is low normal. Tricuspid regurgitation signal is inadequate for assessing PA pressure. Left Atrium: Left atrial size was normal in size. Right Atrium: Right atrial size was normal in size. Pericardium: There is no evidence of pericardial effusion. Mitral Valve: The mitral valve is normal in structure. No evidence of mitral valve regurgitation. No evidence of mitral valve stenosis. Tricuspid Valve: The tricuspid valve is normal in structure. Tricuspid valve regurgitation is not demonstrated. No evidence of tricuspid stenosis. Aortic Valve: The aortic valve is normal in structure. Aortic valve regurgitation is not visualized. No aortic stenosis is present. Pulmonic Valve: The pulmonic valve was normal in structure. Pulmonic valve regurgitation is not visualized. No evidence of pulmonic stenosis. Aorta: The aortic root is normal in size and structure. Venous: The inferior vena cava is normal in size with greater than 50% respiratory variability, suggesting right atrial pressure of 3 mmHg. IAS/Shunts: No atrial level shunt detected by color flow Doppler.  LEFT VENTRICLE PLAX 2D LVIDd:         5.20 cm  Diastology LVIDs:         2.60 cm  LV e' medial:    7.62 cm/s LV PW:         1.20 cm  LV E/e' medial:  6.8 LV IVS:        1.10 cm  LV e' lateral:   10.00 cm/s LVOT diam:     2.50 cm  LV E/e' lateral: 5.2 LV SV:         88 LV SV Index:   38 LVOT Area:     4.91 cm  RIGHT VENTRICLE RV Basal diam:  4.00 cm RV S prime:     11.00 cm/s TAPSE (M-mode): 2.1 cm LEFT ATRIUM           Index       RIGHT ATRIUM           Index LA diam:      3.30 cm 1.42 cm/m  RA Area:      15.50 cm LA Vol (A2C): 38.1 ml 16.38 ml/m RA Volume:   42.80 ml  18.40 ml/m LA Vol (A4C): 53.9 ml 23.17 ml/m  AORTIC VALVE LVOT Vmax:   97.90 cm/s LVOT Vmean:  67.900 cm/s LVOT VTI:    0.179 m  AORTA Ao Root diam: 3.80 cm MITRAL VALVE MV Area (PHT): 3.08 cm    SHUNTS MV Decel Time: 246 msec    Systemic VTI:  0.18 m MV E velocity: 51.80 cm/s  Systemic Diam: 2.50 cm MV A velocity: 58.70 cm/s MV E/A ratio:  0.88 Jeff Croitoru MD Electronically signed by Thurmon Fair MD Signature Date/Time: 09/18/2020/4:41:24 PM    Final    Disposition   Pt is being discharged home today in good condition.  Follow-up Plans & Appointments     Follow-up Information    [name], PA Follow up.   Specialties: Physician Assistant, Cardiology, Radiology Why: [facility] location - follow up appointment has been made on Wednesday Oct 13, 2020 9:15 AM (Arrive by 9:00 AM). Angie is one of the PAs that works with Dr. Royann Shivers and our team. Contact information: [address] [place] [address] [phone]        [facility] Follow up.   Specialty: Cardiology Why: The cardiology office will place a referral to have a repeat sleep study done. Someone should call you with more information. Contact information: [address]  [address] [place] [address] [phone]       Roger Kill, PA-C Follow up.   Specialty: Physician Assistant Why: Please follow up with your primary care provider to discuss improving your blood sugar control. Contact information: [address] [place] [address] [phone]              Discharge Instructions    Amb Referral to Cardiac Rehabilitation   Complete by: As directed    Diagnosis: NSTEMI   After initial evaluation and assessments completed: Virtual Based Care may be provided alone or in conjunction with Phase 2 Cardiac Rehab based on patient barriers.: Yes   Diet -  low sodium heart healthy   Complete by: As directed    Increase activity slowly   Complete by: As directed    No driving for 1 week. No lifting over 10 lbs for 1 week. No sexual activity for 2 weeks. You may return to work in 1 week. Keep procedure site clean & dry. If you notice increased pain, swelling, bleeding or pus, call/return!  You may shower, but no soaking baths/hot tubs/pools for 1 week.  Please see medication list for new medicines.  - You were started on a baby aspirin. Please make sure you get the "enteric coated" kind (we sent in a prescription to your pharmacy just in case). If you find that this aggravates your acid reflux please call us to let us know. - You were started on a cholesterol medicine called atorvastatin in the event that your heart attack was due to small vessel diabetic disease, to reduce progression in the future. - You were started on metoprolol, a medicine used for chest pain and heart protection. - You were given a prescription for as-needed nitrogylcerin. If you have recurrence of chest pain you can try taking this we discussed but definitely let Dr. Erin Hearing office know as well.      Discharge Medications   Allergies as of 09/20/2020      Reactions   Doxycycline Swelling   Hctz [hydrochlorothiazide] Swelling      Medication List    TAKE these medications   ALPRAZolam 1 MG tablet Commonly known as: XANAX Take 1 mg by mouth at bedtime as needed for anxiety.   amphetamine-dextroamphetamine 20 MG tablet Commonly known as: ADDERALL Take 20 mg by mouth daily.   aspirin 81 MG EC tablet Take 1 tablet (81 mg total) by mouth daily. Swallow whole. Start taking on: Sep 21, 2020   atorvastatin 80 MG tablet Commonly known as: LIPITOR Take 1 tablet (80 mg total) by mouth at bedtime.   B COMPLEX-B12 PO Take 1 tablet by mouth daily.   FISH OIL OMEGA-3 PO Take 1 capsule by mouth daily.   Ginkgo Biloba Extract 60 MG Caps Take 1 capsule by mouth.    glipiZIDE 5 MG 24 hr tablet Commonly known as: GLUCOTROL XL Take 5 mg by mouth.   lisinopril 20 MG tablet Commonly known as: ZESTRIL Take 20 mg by mouth daily.   metoprolol tartrate 25 MG tablet Commonly known as: LOPRESSOR Take 0.5 tablets (12.5 mg total) by mouth 2 (two) times daily.   multivitamin capsule Take 1 capsule by mouth daily.   nitroGLYCERIN 0.4 MG SL tablet Commonly known as: NITROSTAT Place 1 tablet (0.4 mg total) under the tongue every 5 (five) minutes as needed for chest pain (up to 3 doses).   pantoprazole 40 MG tablet Commonly known as: Protonix Take 1 tablet (  40 mg total) by mouth daily. 30 minutes before breakfast          Outstanding Labs/Studies   If the patient is tolerating statin at time of follow-up appointment, would consider rechecking liver function/lipid panel in 6-8 weeks.  Duration of Discharge Encounter   Greater than 30 minutes including physician time.  Signed, Laurann Montana, PA-C 09/20/2020, 1:06 PM  Patient seen and examined with Ronie Spies PA-C.  Agree as above, with the following exceptions and changes as noted below. He feels well after cath, radial band in place.. Gen: NAD, CV: RRR, no murmurs, Lungs: clear, Abd: soft, Extrem: Warm, well perfused, no edema, Neuro/Psych: alert and oriented x 3, normal mood and affect. All available labs, radiology testing, previous records reviewed. Discussed likelihood of small vessel disease. Has been started on ASA, statin, BB and ACE-I, would continue these for hx of DM2 and possible small vessel disease. Cath report suggests coronary spasm. I have offered imdur as additional therapy. He would like to discuss with Dr. Salena Saner when he goes for outpatient follow up.  Stable for d/c today.  Parke Poisson, MD

## 2020-09-20 NOTE — Progress Notes (Signed)
ANTICOAGULATION CONSULT NOTE   Pharmacy Consult for heparin Indication: chest pain/ACS  Allergies  Allergen Reactions  . Doxycycline Swelling  . Hctz [Hydrochlorothiazide] Swelling    Patient Measurements: Height: 5\' 11"  (180.3 cm) Weight: 119.1 kg (262 lb 9.1 oz) IBW/kg (Calculated) : 75.3 Heparin Dosing Weight: 100kg  Vital Signs: Temp: 98 F (36.7 C) (05/02 0359) Temp Source: Oral (05/02 0359) BP: 129/83 (05/02 0710) Pulse Rate: 62 (05/02 0710)  Labs: Recent Labs    09/18/20 0335 09/18/20 0552 09/18/20 1037 09/18/20 1213 09/18/20 1213 09/18/20 1936 09/19/20 0021 09/20/20 0045  HGB 13.8  --   --   --   --   --  13.3 12.9*  HCT 39.8  --   --   --   --   --  39.0 38.0*  PLT 317  --   --   --   --   --  293 311  HEPARINUNFRC  --   --   --  0.12*   < > 0.31 0.29* 0.42  CREATININE 0.91  --   --   --   --   --  0.91  --   TROPONINIHS 563* 740* 894* 901*  --   --   --   --    < > = values in this interval not displayed.    Estimated Creatinine Clearance: 136 mL/min (by C-G formula based on SCr of 0.91 mg/dL).   Medical History: Past Medical History:  Diagnosis Date  . ADHD (attention deficit hyperactivity disorder)   . Anxiety   . Arthritis   . Complication of anesthesia yrs ago   woke up during knee surgery  . Diabetes mellitus without complication (HCC)   . GERD (gastroesophageal reflux disease)   . Hypertension   . Numbness and tingling   . Pancreatitis   . Sinusitis     Assessment: 45yo male c/o chest tightness, back pain, and BUE pain x2 nights, pain free on arrival to ED, troponin found to be elevated now on heparin.  AM heparin level within goal range.  CBC stable, no overt bleeding or complications noted.  Goal of Therapy:  Heparin level 0.3-0.7 units/ml Monitor platelets by anticoagulation protocol: Yes   Plan:  -Continue IV heparin at current rate. -F/u plans for heparin after cath lab today.  45yo, Reece Leader,  BCCP Clinical Pharmacist  09/20/2020 7:32 AM   [facility] pharmacy phone numbers are listed on [email or web address]

## 2020-09-20 NOTE — Telephone Encounter (Signed)
-----   Message from Laurann Montana, [place] sent at 09/20/2020 12:25 PM EDT ----- Regarding: Sleep study outpatient Hi team, We are discharging this patient today and he had an abnormal finding on his echo (Right ventricular dilation). Dr. Royann Shivers evaluated him yesterday and recommended he have an outpatient sleep study at discharge. He also has a history of witnessed apnea. Can you please help arrange? I was not sure how to do this on the outpatient end under him. Thank you, Dayna

## 2020-09-20 NOTE — Progress Notes (Signed)
Heart Failure Nurse Navigator Progress Note  Pt admitted for CP. Screened for HV TOC needs, pt does not meet HF standards for continued following at this time.   EF: 55-60%  Navigator available for reassessment of patient as needed.   Ozella Rocks, RN, BSN Heart Failure Nurse Navigator [phone]

## 2020-09-20 NOTE — Interval H&P Note (Signed)
Cath Lab Visit (complete for each Cath Lab visit)  Clinical Evaluation Leading to the Procedure:   ACS: Yes.    Non-ACS:    Anginal Classification: CCS II  Anti-ischemic medical therapy: No Therapy  Non-Invasive Test Results: No non-invasive testing performed  Prior CABG: No previous CABG      History and Physical Interval Note:  09/20/2020 7:30 AM  Jeff Buck  has presented today for surgery, with the diagnosis of NSTEMI.  The various methods of treatment have been discussed with the patient and family. After consideration of risks, benefits and other options for treatment, the patient has consented to  Procedure(s): LEFT HEART CATH AND CORONARY ANGIOGRAPHY (N/A) as a surgical intervention.  The patient's history has been reviewed, patient examined, no change in status, stable for surgery.  I have reviewed the patient's chart and labs.  Questions were answered to the patient's satisfaction.     Nanetta Batty

## 2020-09-20 NOTE — Progress Notes (Signed)
CARDIAC REHAB PHASE I   PRE:  Rate/Rhythm: 64 SR    BP: sitting 128/84    SaO2:   MODE:  Ambulation: 400 ft   POST:  Rate/Rhythm: 82 SR    BP: sitting 123/84     SaO2:   Tolerated well, no angina. Discussed MI, restrictions, smoking cessation, diet, exercise, and CRPII. Pt voiced understanding. Eager to d/c, quit smoking. He feels confident about watching his diet and exercise. Not interested in CRPII but will place referral for [facility].  [phone]   Harriet Masson CES, ACSM 09/20/2020 11:58 AM

## 2020-09-20 NOTE — Telephone Encounter (Addendum)
Message routed to the sleep study PCC's ... Coralee North and Burna Mortimer. Order placed.   Pt has 10/13/20 follow up with A Duke PA.   Too soon for [facility] just left hospital 09/20/20.

## 2020-09-21 ENCOUNTER — Other Ambulatory Visit: Payer: Self-pay

## 2020-09-21 DIAGNOSIS — G4730 Sleep apnea, unspecified: Secondary | ICD-10-CM

## 2020-09-21 NOTE — Telephone Encounter (Signed)
Will route to [place] to make aware as well as sent to sleep pool. Thanks!

## 2020-09-24 ENCOUNTER — Telehealth: Payer: Self-pay | Admitting: *Deleted

## 2020-09-24 NOTE — Telephone Encounter (Signed)
Reached out to the patient and he has declined to do the sleep study. He states he does not have insurance and he can not afford to pay for it. Patient was advised he could apply for assistance after the study but he declined that too.

## 2020-09-24 NOTE — Telephone Encounter (Signed)
-----   Message from Bertram Millard, RN sent at 09/20/2020  3:18 PM EDT ----- Regarding: FW: Sleep study outpatient  ----- Message ----- From: Laurann Montana, PA-C Sent: 09/20/2020  12:26 PM EDT To: Cv Div Nl Triage Subject: Sleep study outpatient                         Hi team, We are discharging this patient today and he had an abnormal finding on his echo (Right ventricular dilation). Dr. Royann Shivers evaluated him yesterday and recommended he have an outpatient sleep study at discharge. He also has a history of witnessed apnea. Can you please help arrange? I was not sure how to do this on the outpatient end under him. Thank you, Dayna

## 2020-09-26 ENCOUNTER — Encounter: Payer: Self-pay | Admitting: Physician Assistant

## 2020-09-26 NOTE — Progress Notes (Signed)
Per Veda Canning message: Reesa Chew, CMA  Bertram Millard, RN; Laurann Montana, PA-C Reached out to the patient and he has declined to do the sleep study. He states he does not have insurance and he can not afford to pay for it. Patient was advised he could apply for assistance after the study but he declined that too.

## 2020-10-03 ENCOUNTER — Encounter: Payer: Self-pay | Admitting: Cardiovascular Disease

## 2020-10-11 NOTE — Progress Notes (Deleted)
Cardiology Office Note:    Date:  10/11/2020   ID:  Jeff Buck, DOB 27-Sep-1975, MRN [id number]  PCP:  Roger Kill, PA-C  Cardiologist:  Thurmon Fair, MD   Referring MD: Roger Kill, *   No chief complaint on file. ***  History of Present Illness:    Jeff Buck is a 45 y.o. male with a hx of HTN, hypertriglyceridemia, DM2, ADHD, anxiety, obesity, and tobacco abuse. He was recently hospitalized 09/18/20 for chest pain. HST peaked at 901. Heart cath showed angiographically normal coronaries. CTA negative for PE. He was not tachycardic, tachypneic, or hypoxic. Suspected possible vasospasm vs diabetic small vessel disease.  He was discharged on ASA, lopressor, and lipitor. PPI on board for GERD in the setting of ASA. Echocardiogram did showe RV dilation, but no PE on CTA, as above. OSA was suspected, but he has declined sleep study due to no insurance. A1c was 7.6%, on glipizide. LDL was 57. Given DM and possible small vessel disease, 80 mg lipitor was continued.   He presents today for scheduled follow up after heart cath.     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 ADHD (attention deficit hyperactivity disorder)   . Anxiety   . Arthritis   . Complication of anesthesia yrs ago   woke up during knee surgery  . Diabetes mellitus without complication (HCC)   . GERD (gastroesophageal reflux disease)   . Hypertension   . Numbness and tingling   . Pancreatitis   . Sinusitis     Past Surgical History:  Procedure Laterality Date  . CHOLECYSTECTOMY N/A 12/15/2015   Procedure: LAPAROSCOPIC CHOLECYSTECTOMY;  Surgeon: [name], MD;  Location: [facility];  Service: General;  Laterality: N/A;  .  EUS N/A 09/23/2015   Dr. Christella Hartigan: examined esophagus, stomach, and duodenum were normal. Pancreas normal. Question of microlithiasis as culprit   . LEFT HEART CATH AND CORONARY ANGIOGRAPHY N/A 09/20/2020   Procedure: LEFT HEART CATH AND CORONARY ANGIOGRAPHY;  Surgeon: Runell Gess, MD;  Location: [facility] INVASIVE [facility];  Service: Cardiovascular;  Laterality: N/A;  . ORTHOPEDIC SURGERY     left knee surgery    Current Medications: No outpatient medications have been marked as taking for the 10/13/20 encounter (Appointment) with [name], PA.     Allergies:   Doxycycline and Hctz [hydrochlorothiazide]   Social History   Socioeconomic History  . Marital status: Legally Separated    Spouse name: Not on file  . Number of children: 3  . Years of education: 11th grade  . Highest education level: Not on file  Occupational History  . Occupation: Nurse, children's  Tobacco Use  . Smoking status: Former Smoker    Packs/day: 0.25    Years: 0.50    Pack years: 0.12    Types: Cigarettes  . Smokeless tobacco: Current User    Types: Chew  Substance and Sexual Activity  . Alcohol use: No    Alcohol/week: 0.0 standard drinks  . Drug use: No  . Sexual activity: Not Currently  Other Topics Concern  . Not on file  Social History Narrative   Lives at home with one of his sons.  Right-handed.   Caffeine: 3 sodas per day.   Social Determinants of Health   Financial Resource Strain: Not on file  Food Insecurity: Not on file  Transportation Needs: Not on file  Physical Activity: Not on file  Stress: Not on file  Social Connections: Not on file     Family History: The patient's ***family history includes Diabetes in his father and mother; Heart failure in an other family member. There is no history of Colon cancer.  ROS:   Please see the history of present illness.    *** All other systems reviewed and are negative.  EKGs/Labs/Other Studies Reviewed:    The following studies  were reviewed today:  2D echo 09/18/20 1. Left ventricular ejection fraction, by estimation, is 55 to 60%. The left ventricle has normal function. The left ventricle has no regional wall motion abnormalities. Left ventricular diastolic parameters were normal.  2. McConnell's sign may be present (limited evaluation of RV, seen well only on Definity images). Right ventricular systolic function is low normal. The right ventricular size is mildly enlarged. Tricuspid regurgitation signal is inadequate for  assessing PA pressure.  3. The mitral valve is normal in structure. No evidence of mitral valve regurgitation. No evidence of mitral stenosis.  4. The aortic valve is normal in structure. Aortic valve regurgitation is not visualized. No aortic stenosis is present.  5. The inferior vena cava is normal in size with greater than 50%  respiratory variability, suggesting right atrial pressure of 3 mmHg.   Conclusion(s)/Recommendation(s): Consider evaluation for pulmonary embolism.    Cardiac Cath 09/20/20   Intervention    IMPRESSION:Mr. [name] has clean coronary arteries and normal LV function by 2D echo with normal LVEDP. There were no culprit lesions. I suspect this was related to coronary vasospasm. The sheath was removed and a TR band was placed on the right wrist to achieve patent hemostasis. The patient left lab in stable condition.  EKG:  EKG is *** ordered today.  The ekg ordered today demonstrates ***  Recent Labs: 09/18/2020: ALT 29 09/19/2020: BUN 15; Creatinine, Ser 0.91; Potassium 3.9; Sodium 134; TSH 1.691 09/20/2020: Hemoglobin 12.9; Platelets 311  Recent Lipid Panel    Component Value Date/Time   CHOL 163 09/19/2020 0021   TRIG 325 (H) 09/19/2020 0021   HDL 41 09/19/2020 0021   CHOLHDL 4.0 09/19/2020 0021   VLDL 65 (H) 09/19/2020 0021   LDLCALC 57 09/19/2020 0021    Physical Exam:    VS:  There were no vitals taken for this visit.    Wt Readings from Last 3  Encounters:  09/20/20 262 lb 9.1 oz (119.1 kg)  03/05/20 257 lb (116.6 kg)  02/24/20 235 lb (106.6 kg)     GEN: *** Well nourished, well developed in no acute distress HEENT: Normal NECK: No JVD; No carotid bruits LYMPHATICS: No lymphadenopathy CARDIAC: ***RRR, no murmurs, rubs, gallops RESPIRATORY:  Clear to auscultation without rales, wheezing or rhonchi  ABDOMEN: Soft, non-tender, non-distended MUSCULOSKELETAL:  No edema; No deformity  SKIN: Warm and dry NEUROLOGIC:  Alert and oriented x 3 PSYCHIATRIC:  Normal affect   ASSESSMENT:    No diagnosis found. PLAN:    In order of problems listed above:  No diagnosis found.   Medication Adjustments/Labs and Tests Ordered: Current medicines are reviewed at length with the patient today.  Concerns regarding medicines are outlined above.  No orders of the defined types were placed in this encounter.  No orders of the defined  types were placed in this encounter.   Signed, [name], [place]  10/11/2020 10:16 PM    [facility]

## 2020-10-13 ENCOUNTER — Ambulatory Visit: Payer: Self-pay | Admitting: Physician Assistant

## 2021-05-02 ENCOUNTER — Telehealth: Payer: Self-pay

## 2021-05-02 NOTE — Telephone Encounter (Signed)
Letter has been sent to patient informing them that their sleep study has expired. Patient will need to call and schedule an office visit to re-evaluate the need for a sleep study.    

## 2021-11-24 ENCOUNTER — Emergency Department (HOSPITAL_COMMUNITY): Payer: Self-pay

## 2021-11-24 ENCOUNTER — Encounter (HOSPITAL_COMMUNITY): Payer: Self-pay

## 2021-11-24 ENCOUNTER — Other Ambulatory Visit: Payer: Self-pay

## 2021-11-24 ENCOUNTER — Emergency Department (HOSPITAL_COMMUNITY)
Admission: EM | Admit: 2021-11-24 | Discharge: 2021-11-24 | Disposition: A | Discharge status: Home / Self Care | Payer: Self-pay | Attending: Emergency Medicine | Admitting: Emergency Medicine

## 2021-11-24 DIAGNOSIS — S0990XA Unspecified injury of head, initial encounter: Secondary | ICD-10-CM | POA: Insufficient documentation

## 2021-11-24 DIAGNOSIS — W01198A Fall on same level from slipping, tripping and stumbling with subsequent striking against other object, initial encounter: Secondary | ICD-10-CM | POA: Insufficient documentation

## 2021-11-24 DIAGNOSIS — Z7982 Long term (current) use of aspirin: Secondary | ICD-10-CM | POA: Insufficient documentation

## 2021-11-24 DIAGNOSIS — Z79899 Other long term (current) drug therapy: Secondary | ICD-10-CM | POA: Insufficient documentation

## 2021-11-24 DIAGNOSIS — I10 Essential (primary) hypertension: Secondary | ICD-10-CM | POA: Insufficient documentation

## 2021-11-24 NOTE — ED Triage Notes (Signed)
Pt presents with an intermittent HA that started on 6/25. On 6/27 pt slipped in the mud and hit his head on a post 1 week prior. Pt reports some nausea this AM.

## 2021-11-24 NOTE — ED Notes (Signed)
Patient transported to CT 

## 2021-11-24 NOTE — Discharge Instructions (Addendum)
Imaging was reassuring, it is possible that you have a slight concussion, symptoms include a mild headache, brain fog, dizziness, lightheadedness, difficulty concentrating, increase sensitivity to light or noise.  The symptoms will resolve on their own I recommend brain rest i.e. decreasing screen time, vigorous activities and slowly reintroduce them as tolerated.  If your symptoms persist over the weeks time please follow-up with your PCP and/or the concussion clinic for further evaluation you may take over-the-counter pain medication as needed.

## 2021-11-24 NOTE — ED Provider Notes (Signed)
[facility] EMERGENCY DEPARTMENT Provider Note   CSN: [id number] Arrival date & time: 11/24/21  1412     History  Chief Complaint  Patient presents with   Head Injury    Jeff Buck is a 46 y.o. male.  HPI  Medical history including hypertension, anxiety, sinusitis presents  with complaints of a head injury.  Patient states that he had a mechanical fall on 06/25, states that he was laying on horses and slipped causing fall onto his left side he states he is hit his head on the post, thinks he might of had a brief loss of conscious, states that he is able to get up under his own accord, states since then he has been having intermittent headaches, states he feels headache in the front of his head, denies change in vision paresthesias or weakness of her lower extremities, denies any lightheaded or dizziness.  Patient does note that he had these headaches prior to his fall, and attributes to having sinusitis.  He states that today he woke up had a headache notes that his blood pressure was elevated and has some nausea, and a nosebleed.  This is since resolved he states he has no headache at this time.  He is notes that his wife and as well as his doctor wanted to come here for further evaluation.    Home Medications Prior to Admission medications   Medication Sig Start Date End Date Taking? Authorizing Provider  ALPRAZolam Prudy Feeler) 1 MG tablet Take 1 mg by mouth at bedtime as needed for anxiety. 12/22/19   [provider]  amphetamine-dextroamphetamine (ADDERALL) 20 MG tablet Take 20 mg by mouth daily. 01/17/20   [provider]  aspirin EC 81 MG EC tablet Take 1 tablet (81 mg total) by mouth daily. Swallow whole. 09/21/20   Dunn, Tacey Ruiz, PA-C  atorvastatin (LIPITOR) 80 MG tablet Take 1 tablet (80 mg total) by mouth at bedtime. 09/20/20   Dunn, Tacey Ruiz, PA-C  B Complex Vitamins (B COMPLEX-B12 PO) Take 1 tablet by mouth daily.    [provider]  Ginkgo Biloba  Extract 60 MG CAPS Take 1 capsule by mouth.    [provider]  glipiZIDE (GLUCOTROL XL) 5 MG 24 hr tablet Take 5 mg by mouth. 02/13/20   [provider]  lisinopril (PRINIVIL,ZESTRIL) 20 MG tablet Take 20 mg by mouth daily.     [provider]  metoprolol tartrate (LOPRESSOR) 25 MG tablet Take 0.5 tablets (12.5 mg total) by mouth 2 (two) times daily. 09/20/20   Dunn, Tacey Ruiz, PA-C  Multiple Vitamin (MULTIVITAMIN) capsule Take 1 capsule by mouth daily.    [provider]  nitroGLYCERIN (NITROSTAT) 0.4 MG SL tablet Place 1 tablet (0.4 mg total) under the tongue every 5 (five) minutes as needed for chest pain (up to 3 doses). 09/20/20   Dunn, Tacey Ruiz, PA-C  Omega-3 Fatty Acids (FISH OIL OMEGA-3 PO) Take 1 capsule by mouth daily.    [provider]  pantoprazole (PROTONIX) 40 MG tablet Take 1 tablet (40 mg total) by mouth daily. 30 minutes before breakfast 06/16/16   Gelene Mink, NP      Allergies    Doxycycline and Hctz [hydrochlorothiazide]    Review of Systems   Review of Systems  Constitutional:  Negative for chills and fever.  Respiratory:  Negative for shortness of breath.   Cardiovascular:  Negative for chest pain.  Gastrointestinal:  Negative for abdominal pain.  Neurological:  Negative for headaches.    Physical Exam Updated Vital Signs BP (!) 121/93 (BP Location: Right Arm)   Pulse 79   Temp 97.8 F (36.6 C) (Oral)   Resp 17   Ht 5\' 11"  (1.803 m)   Wt 106.1 kg   SpO2 97%   BMI 32.64 kg/m  Physical Exam Vitals and nursing note reviewed.  Constitutional:      General: He is not in acute distress.    Appearance: He is not ill-appearing.  HENT:     Head: Normocephalic and atraumatic.     Comments: No deformity the head present no raccoon eyes or battle sign noted.    Nose: No congestion.     Mouth/Throat:     Mouth: Mucous membranes are moist.     Pharynx: Oropharynx is clear.     Comments: No trismus no torticollis Eyes:      Extraocular Movements: Extraocular movements intact.     Conjunctiva/sclera: Conjunctivae normal.     Pupils: Pupils are equal, round, and reactive to light.  Cardiovascular:     Rate and Rhythm: Normal rate and regular rhythm.     Pulses: Normal pulses.     Heart sounds: No murmur heard.    No friction rub. No gallop.  Pulmonary:     Effort: No respiratory distress.     Breath sounds: No wheezing, rhonchi or rales.  Musculoskeletal:     Comments: Spine was palpated was nontender to palpation no step-off or deformities noted.  Patient upper and lower extremities were palpated they are nontender.  Skin:    General: Skin is warm and dry.  Neurological:     Mental Status: He is alert.     GCS: GCS eye subscore is 4. GCS verbal subscore is 5. GCS motor subscore is 6.     Cranial Nerves: Cranial nerves 2-12 are intact. No cranial nerve deficit.     Sensory: Sensation is intact.     Motor: No weakness.     Coordination: Romberg sign negative. Finger-Nose-Finger Test normal.     Gait: Gait normal.     Comments: Cranial nerves II through XII grossly intact no difficulty with word finding, following two-step commands, no unilateral weakness present.  Psychiatric:        Mood and Affect: Mood normal.     ED Results / Procedures / Treatments   Labs (all labs ordered are listed, but only abnormal results are displayed) Labs Reviewed - No data to display  EKG None  Radiology CT Head Wo Contrast  Result Date: 11/24/2021 CLINICAL DATA:  Head trauma, moderate-severe.  Headache. EXAM: CT HEAD WITHOUT CONTRAST TECHNIQUE: Contiguous axial images were obtained from the base of the skull through the vertex without intravenous contrast. RADIATION DOSE REDUCTION: This exam was performed according to the departmental dose-optimization program which includes automated exposure control, adjustment of the mA and/or kV according to patient size and/or use of iterative reconstruction technique. COMPARISON:   Head CT 06/22/2013 FINDINGS: Brain: There is no evidence of an acute infarct, intracranial hemorrhage, mass, midline shift, or extra-axial fluid collection. 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 The ventricles and sulci are normal. Vascular: No hyperdense vessel. Skull: No fracture or suspicious osseous lesion. Chronic widening of the internal auditory canals. Sinuses/Orbits: Minimal mucosal thickening in the included paranasal sinuses. Clear mastoid air cells. Unremarkable orbits. Other: None. IMPRESSION: No evidence of acute intracranial abnormality. Electronically Signed   By: 08/20/2013.D.  On: 11/24/2021 16:04    Procedures Procedures    Medications Ordered in ED Medications - No data to display  ED Course/ Medical Decision Making/ A&P                           Medical Decision Making Amount and/or Complexity of Data Reviewed Radiology: ordered.   This patient presents to the ED for concern of head trauma, this involves an extensive number of treatment options, and is a complaint that carries with it a high risk of complications and morbidity.  The differential diagnosis includes internal head bleed, CVA, cranial fracture    Additional history obtained:  Additional history obtained from N/A External records from outside source obtained and reviewed including PCP notes   Co morbidities that complicate the patient evaluation  N/A  Social Determinants of Health:  N/A    Lab Tests:  I Ordered, and personally interpreted labs.  The pertinent results include: N/A   Imaging Studies ordered:  I ordered imaging studies including CT head I independently visualized and interpreted imaging which showed negative acute findings I agree with the radiologist interpretation   Cardiac Monitoring:  The patient was maintained on a cardiac monitor.  I personally viewed and interpreted the cardiac monitored which showed an underlying rhythm of:  N/A   Medicines ordered and prescription drug management:  I ordered medication including N/A  I have reviewed the patients home medicines and have made adjustments as needed  Critical Interventions:  N/A   Reevaluation:  Presents with a head trauma, endorses that he had a brief loss of conscious, will obtain CT head for rule out of subdural head bleed.  CT head was negative, patient agreement plan discharge at this time.  Consultations Obtained:  N/A    Test Considered:  N/A    Rule out low suspicion for intracranial head bleed , is not on anticoagulant, he is currently not endorse headaches, paresthesia/weakness in the upper and lower extremities, no focal deficits present on my exam, CT head negative acute findings.  Low suspicion for spinal cord abnormality or spinal fracture spine was palpated was nontender to palpation, patient has full range of motion in the upper and lower extremities.  No suspicion for hypertensive emergency urgency as blood pressure is within normal limits x2 during this visit.   Dispostion and problem list  After consideration of the diagnostic results and the patients response to treatment, I feel that the patent would benefit from discharge  Head injury-imaging was unremarkable as exam was unremarkable, possibly new headaches are from sinusitis and/or mild concussion will on file with PCP for further evaluation.            Final Clinical Impression(s) / ED Diagnoses Final diagnoses:  Injury of head, initial encounter    Rx / DC Orders ED Discharge Orders     None         Timouthy, [place], PA-C 11/24/21 1621    Mancel Bale, MD 11/25/21 1052

## 2022-09-06 ENCOUNTER — Other Ambulatory Visit (HOSPITAL_COMMUNITY): Payer: Self-pay

## 2022-09-06 MED ORDER — OZEMPIC (0.25 OR 0.5 MG/DOSE) 2 MG/3ML ~~LOC~~ SOPN
0.2500 mg | PEN_INJECTOR | SUBCUTANEOUS | 0 refills | Status: AC
  Filled 2022-09-06: qty 3, 30d supply, fill #0

## 2022-09-06 MED ORDER — METHYLPHENIDATE HCL 20 MG PO TABS
20.0000 mg | ORAL_TABLET | Freq: Every day | ORAL | 0 refills | Status: AC
  Filled 2022-09-06: qty 30, 30d supply, fill #0

## 2022-09-06 MED ORDER — OZEMPIC (1 MG/DOSE) 4 MG/3ML ~~LOC~~ SOPN
1.0000 mg | PEN_INJECTOR | SUBCUTANEOUS | 0 refills | Status: AC
  Filled 2022-09-06: qty 9, 84d supply, fill #0

## 2022-09-08 ENCOUNTER — Other Ambulatory Visit (HOSPITAL_COMMUNITY): Payer: Self-pay

## 2022-09-18 ENCOUNTER — Other Ambulatory Visit (HOSPITAL_COMMUNITY): Payer: Self-pay
